# Patient Record
Sex: Female | Born: 1975 | Race: White | Marital: Married | Smoking: Current every day smoker
Health system: Southern US, Community
[De-identification: ages and names within clinical notes are randomized; demographics above are authoritative.]

## PROBLEM LIST (undated history)

## (undated) DIAGNOSIS — F419 Anxiety disorder, unspecified: Secondary | ICD-10-CM

## (undated) DIAGNOSIS — E039 Hypothyroidism, unspecified: Secondary | ICD-10-CM

## (undated) DIAGNOSIS — E669 Obesity, unspecified: Secondary | ICD-10-CM

## (undated) DIAGNOSIS — K047 Periapical abscess without sinus: Secondary | ICD-10-CM

## (undated) HISTORY — PX: ROOT CANAL: SHX2363

---
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
## 2010-06-25 ENCOUNTER — Other Ambulatory Visit (HOSPITAL_COMMUNITY): Payer: Self-pay | Admitting: Endocrinology

## 2010-06-25 DIAGNOSIS — E0500 Thyrotoxicosis with diffuse goiter without thyrotoxic crisis or storm: Secondary | ICD-10-CM

## 2010-06-30 ENCOUNTER — Encounter (HOSPITAL_COMMUNITY)
Admission: RE | Admit: 2010-06-30 | Discharge: 2010-06-30 | Disposition: A | Discharge status: Home / Self Care | Payer: BC Managed Care – PPO | Source: Ambulatory Visit | Attending: Endocrinology | Admitting: Endocrinology

## 2010-06-30 DIAGNOSIS — E0500 Thyrotoxicosis with diffuse goiter without thyrotoxic crisis or storm: Secondary | ICD-10-CM | POA: Insufficient documentation

## 2010-07-01 ENCOUNTER — Ambulatory Visit (HOSPITAL_COMMUNITY)
Admission: RE | Admit: 2010-07-01 | Discharge: 2010-07-01 | Disposition: A | Discharge status: Home / Self Care | Payer: BC Managed Care – PPO | Source: Ambulatory Visit | Attending: Endocrinology | Admitting: Endocrinology

## 2010-07-01 DIAGNOSIS — E0500 Thyrotoxicosis with diffuse goiter without thyrotoxic crisis or storm: Secondary | ICD-10-CM | POA: Insufficient documentation

## 2010-07-01 MED ORDER — SODIUM IODIDE I 131 CAPSULE
10.0000 | Freq: Once | INTRAVENOUS | Status: AC | PRN
  Administered 2010-06-30: 10 via ORAL

## 2010-07-01 MED ORDER — SODIUM PERTECHNETATE TC 99M INJECTION
9.8000 | Freq: Once | INTRAVENOUS | Status: AC | PRN
  Administered 2010-07-01: 9.8 via INTRAVENOUS

## 2010-07-03 ENCOUNTER — Other Ambulatory Visit (HOSPITAL_COMMUNITY): Payer: Self-pay | Admitting: Endocrinology

## 2010-07-03 ENCOUNTER — Ambulatory Visit (HOSPITAL_COMMUNITY): Payer: Self-pay

## 2010-07-03 DIAGNOSIS — E0500 Thyrotoxicosis with diffuse goiter without thyrotoxic crisis or storm: Secondary | ICD-10-CM

## 2010-07-07 ENCOUNTER — Ambulatory Visit (HOSPITAL_COMMUNITY): Payer: Self-pay

## 2010-07-08 ENCOUNTER — Other Ambulatory Visit (HOSPITAL_COMMUNITY): Payer: Self-pay

## 2010-07-17 ENCOUNTER — Encounter (HOSPITAL_COMMUNITY)
Admission: RE | Admit: 2010-07-17 | Discharge: 2010-07-17 | Disposition: A | Discharge status: Home / Self Care | Payer: BC Managed Care – PPO | Source: Ambulatory Visit | Attending: Endocrinology | Admitting: Endocrinology

## 2010-07-17 DIAGNOSIS — E0500 Thyrotoxicosis with diffuse goiter without thyrotoxic crisis or storm: Secondary | ICD-10-CM | POA: Insufficient documentation

## 2010-07-17 LAB — HCG, SERUM, QUALITATIVE: Preg, Serum: NEGATIVE

## 2010-07-17 MED ORDER — SODIUM IODIDE I 131 CAPSULE
12.7000 | Freq: Once | INTRAVENOUS | Status: AC | PRN
  Administered 2010-07-17: 12.7 via ORAL

## 2017-05-28 ENCOUNTER — Emergency Department (HOSPITAL_COMMUNITY): Payer: BLUE CROSS/BLUE SHIELD

## 2017-05-28 ENCOUNTER — Inpatient Hospital Stay (HOSPITAL_COMMUNITY)
Admission: EM | Admit: 2017-05-28 | Discharge: 2017-06-04 | DRG: 158 | Disposition: A | Discharge status: Home / Self Care | Payer: BLUE CROSS/BLUE SHIELD | Attending: Family Medicine | Admitting: Family Medicine

## 2017-05-28 ENCOUNTER — Other Ambulatory Visit: Payer: Self-pay

## 2017-05-28 DIAGNOSIS — L03211 Cellulitis of face: Secondary | ICD-10-CM | POA: Diagnosis present

## 2017-05-28 DIAGNOSIS — E669 Obesity, unspecified: Secondary | ICD-10-CM | POA: Diagnosis present

## 2017-05-28 DIAGNOSIS — K122 Cellulitis and abscess of mouth: Principal | ICD-10-CM | POA: Diagnosis present

## 2017-05-28 DIAGNOSIS — E876 Hypokalemia: Secondary | ICD-10-CM | POA: Diagnosis present

## 2017-05-28 DIAGNOSIS — T380X5A Adverse effect of glucocorticoids and synthetic analogues, initial encounter: Secondary | ICD-10-CM | POA: Diagnosis present

## 2017-05-28 DIAGNOSIS — Z79890 Hormone replacement therapy (postmenopausal): Secondary | ICD-10-CM

## 2017-05-28 DIAGNOSIS — E039 Hypothyroidism, unspecified: Secondary | ICD-10-CM | POA: Diagnosis present

## 2017-05-28 DIAGNOSIS — K047 Periapical abscess without sinus: Secondary | ICD-10-CM | POA: Diagnosis not present

## 2017-05-28 DIAGNOSIS — R739 Hyperglycemia, unspecified: Secondary | ICD-10-CM | POA: Diagnosis present

## 2017-05-28 DIAGNOSIS — F17210 Nicotine dependence, cigarettes, uncomplicated: Secondary | ICD-10-CM | POA: Diagnosis present

## 2017-05-28 DIAGNOSIS — B373 Candidiasis of vulva and vagina: Secondary | ICD-10-CM | POA: Diagnosis present

## 2017-05-28 DIAGNOSIS — Z7952 Long term (current) use of systemic steroids: Secondary | ICD-10-CM

## 2017-05-28 DIAGNOSIS — D72829 Elevated white blood cell count, unspecified: Secondary | ICD-10-CM | POA: Diagnosis present

## 2017-05-28 DIAGNOSIS — Z6841 Body Mass Index (BMI) 40.0 and over, adult: Secondary | ICD-10-CM

## 2017-05-28 HISTORY — DX: Obesity, unspecified: E66.9

## 2017-05-28 HISTORY — DX: Periapical abscess without sinus: K04.7

## 2017-05-28 HISTORY — DX: Hypothyroidism, unspecified: E03.9

## 2017-05-28 HISTORY — DX: Anxiety disorder, unspecified: F41.9

## 2017-05-28 LAB — CBC WITH DIFFERENTIAL/PLATELET
BASOS ABS: 0 10*3/uL (ref 0.0–0.1)
Basophils Relative: 0 %
EOS ABS: 0 10*3/uL (ref 0.0–0.7)
Eosinophils Relative: 0 %
HCT: 42.5 % (ref 36.0–46.0)
HEMOGLOBIN: 13.2 g/dL (ref 12.0–15.0)
LYMPHS ABS: 3.9 10*3/uL (ref 0.7–4.0)
LYMPHS PCT: 19 %
MCH: 25.9 pg — AB (ref 26.0–34.0)
MCHC: 31.1 g/dL (ref 30.0–36.0)
MCV: 83.3 fL (ref 78.0–100.0)
Monocytes Absolute: 1.9 10*3/uL — ABNORMAL HIGH (ref 0.1–1.0)
Monocytes Relative: 9 %
Neutro Abs: 15.3 10*3/uL — ABNORMAL HIGH (ref 1.7–7.7)
Neutrophils Relative %: 72 %
Platelets: 451 10*3/uL — ABNORMAL HIGH (ref 150–400)
RBC: 5.1 MIL/uL (ref 3.87–5.11)
RDW: 16.2 % — ABNORMAL HIGH (ref 11.5–15.5)
WBC: 21.2 10*3/uL — AB (ref 4.0–10.5)

## 2017-05-28 LAB — COMPREHENSIVE METABOLIC PANEL
ALK PHOS: 80 U/L (ref 38–126)
ALT: 11 U/L — ABNORMAL LOW (ref 14–54)
AST: 21 U/L (ref 15–41)
Albumin: 3.3 g/dL — ABNORMAL LOW (ref 3.5–5.0)
Anion gap: 12 (ref 5–15)
BUN: 12 mg/dL (ref 6–20)
CALCIUM: 8.9 mg/dL (ref 8.9–10.3)
CHLORIDE: 106 mmol/L (ref 101–111)
CO2: 22 mmol/L (ref 22–32)
CREATININE: 0.69 mg/dL (ref 0.44–1.00)
GFR calc non Af Amer: >60 mL/min (ref >60)
GLUCOSE: 183 mg/dL — AB (ref 65–99)
Potassium: 3.2 mmol/L — ABNORMAL LOW (ref 3.5–5.1)
SODIUM: 140 mmol/L (ref 135–145)
Total Bilirubin: 0.5 mg/dL (ref 0.3–1.2)
Total Protein: 6.2 g/dL — ABNORMAL LOW (ref 6.5–8.1)

## 2017-05-28 LAB — POC URINE PREG, ED: Preg Test, Ur: NEGATIVE

## 2017-05-28 MED ORDER — CLINDAMYCIN PHOSPHATE 600 MG/50ML IV SOLN
600.0000 mg | Freq: Once | INTRAVENOUS | Status: AC
  Administered 2017-05-29: 600 mg via INTRAVENOUS
  Filled 2017-05-28: qty 50

## 2017-05-28 MED ORDER — ONDANSETRON HCL 4 MG/2ML IJ SOLN
4.0000 mg | Freq: Once | INTRAMUSCULAR | Status: AC
Start: 2017-05-28 — End: 2017-05-28
  Administered 2017-05-28: 4 mg via INTRAVENOUS
  Filled 2017-05-28: qty 2

## 2017-05-28 MED ORDER — IOPAMIDOL (ISOVUE-300) INJECTION 61%
INTRAVENOUS | Status: AC
  Administered 2017-05-28: 75 mL
  Filled 2017-05-28: qty 75

## 2017-05-28 MED ORDER — MORPHINE SULFATE (PF) 4 MG/ML IV SOLN
6.0000 mg | Freq: Once | INTRAVENOUS | Status: AC
  Administered 2017-05-28: 6 mg via INTRAVENOUS
  Filled 2017-05-28: qty 2

## 2017-05-28 MED ORDER — IOPAMIDOL (ISOVUE-300) INJECTION 61%
INTRAVENOUS | Status: AC
  Filled 2017-05-28: qty 75

## 2017-05-28 NOTE — ED Notes (Signed)
ED Provider at bedside. 

## 2017-05-28 NOTE — ED Triage Notes (Signed)
Pt in reports root canal x 10 days ago, pt started antibiotic Amoxicillen pt took a wks worth and then was stopped because R facial swelling persisted, pt then switched to Augmentin this Tues, pt seen by dentist today and told to come here for worsening R sided facial swelling, pt airway intact, speaks in full sentences, no drooling noted, A&O x4

## 2017-05-28 NOTE — ED Notes (Addendum)
Last took one ultracet (37.5-325) at 1830 today. No relief reported. Ice pack given for comfort.

## 2017-05-28 NOTE — ED Provider Notes (Signed)
[facility]ENC[facility] Provider Note   CSN: [id number] Arrival date & time: 05/28/17  1517     History   Chief Complaint Chief Complaint  Patient presents with  . Facial Swelling    HPI Ashley Donaldson is a 42 y.o. female.  HPI   42 year old female sent here from her oral surgeon for evaluation of neck pain and swelling.  Patient had a root canal on the right lower teeth approximately 10 days ago.  Since then she developed pain and swelling to the affected area.  She was initially treated with amoxicillin then switched over to Augmentin along with prednisone but her symptom has not improved.  She also endorsed having a fever 2 days ago.  She was seen by her oral surgeon today.  She was told that her tooth appears to be normal however the swelling is concerning and patient was recommended to come to the ER for further evaluation.  Patient has been trying eyes, Tylenol ibuprofen and Ultracet at home with some improvement.  She reports chewing makes it worse.  She denies having sore throat or difficulty swallowing.  She did report some pressure behind the right ear but denies any hearing changes.  No shortness of breath, abdominal pain.     No past medical history on file.  There are no active problems to display for this patient.   The histories are not reviewed yet. Please review them in the "History" navigator section and refresh this SmartLink.  OB History    No data available       Home Medications    Prior to Admission medications   Medication Sig Start Date End Date Taking? Authorizing Provider  acetaminophen (TYLENOL) 500 MG tablet Take 500 mg by mouth every 6 (six) hours as needed for headache (pain).   Yes [provider]  ALPRAZolam Prudy Feeler(XANAX) 0.5 MG tablet Take 0.5 mg by mouth daily as needed for anxiety.   Yes [provider]  amoxicillin-clavulanate (AUGMENTIN) 500-125 MG tablet Take 1 tablet by mouth 3 (three) times  daily. 10 day course started 05/25/17   Yes [provider]  Guaifenesin (MUCINEX MAXIMUM STRENGTH) 1200 MG TB12 Take 1,200 mg by mouth 2 (two) times daily as needed (sinus headache).   Yes [provider]  ibuprofen (ADVIL,MOTRIN) 200 MG tablet Take 400 mg by mouth every 6 (six) hours as needed (pain).   Yes [provider]  levothyroxine (SYNTHROID, LEVOTHROID) 175 MCG tablet Take 175 mcg by mouth daily before breakfast.   Yes [provider]  predniSONE (DELTASONE) 5 MG tablet Take 5-30 mg by mouth See admin instructions. Tapered course started 05/25/17: take six tablets (30 mg) by mouth 1st day, then take five tablets (25 mg) 2nd day, then take four tablets (20 mg) 3rd day, then take three tablets (15 mg) 4th day, then take two tablets (10 mg) 5th day, then take one tablet (5 mg) 6th day, then stop   Yes [provider]  traMADol-acetaminophen (ULTRACET) 37.5-325 MG tablet Take 1-2 tablets by mouth every 4 (four) hours as needed (pain).   Yes [provider]    Family History No family history on file.  Social History Social History   Tobacco Use  . Smoking status: Not on file  Substance Use Topics  . Alcohol use: Not on file  . Drug use: Not on file     Allergies   Methylpyrrolidone; Ciprofloxacin; and Omni-pac   Review of Systems  Review of Systems  All other systems reviewed and are negative.    Physical Exam Updated Vital Signs BP (!) 143/87   Pulse 67   Temp 98.1 F (36.7 C) (Oral)   Resp 16   Ht 5\' 1"  (1.549 m)   Wt 108.9 kg (240 lb)   LMP 04/28/2017 (Approximate)   SpO2 98%   BMI 45.35 kg/m   Physical Exam  Constitutional: She appears well-developed and well-nourished. No distress.  Obese female appears uncomfortable but nontoxic in appearance  HENT:  Head: Atraumatic.  Ears: TMs are normal bilaterally Nose: Normal nares Throat: Mild trismus, white plaque noted on the oral mucosa, tenderness to right  lower gumline.  Moderate amount of swelling and induration noted to right lower chin with tenderness to palpation.  Eyes: Conjunctivae are normal.  Neck: Normal range of motion. Neck supple. No tracheal deviation present. No thyromegaly present.  Cardiovascular: Normal rate and regular rhythm.  Pulmonary/Chest: Effort normal and breath sounds normal.  Abdominal: Soft. Bowel sounds are normal. She exhibits no distension. There is no tenderness.  Lymphadenopathy:    She has cervical adenopathy.  Neurological: She is alert.  Skin: No rash noted.  Psychiatric: She has a normal mood and affect.  Nursing note and vitals reviewed.    ED Treatments / Results  Labs (all labs ordered are listed, but only abnormal results are displayed) Labs Reviewed  CBC WITH DIFFERENTIAL/PLATELET - Abnormal; Notable for the following components:      Result Value   WBC 21.2 (*)    MCH 25.9 (*)    RDW 16.2 (*)    Platelets 451 (*)    Neutro Abs 15.3 (*)    Monocytes Absolute 1.9 (*)    All other components within normal limits  COMPREHENSIVE METABOLIC PANEL - Abnormal; Notable for the following components:   Potassium 3.2 (*)    Glucose, Bld 183 (*)    Total Protein 6.2 (*)    Albumin 3.3 (*)    ALT 11 (*)    All other components within normal limits  POC URINE PREG, ED    EKG  EKG Interpretation None       Radiology Ct Soft Tissue Neck W Contrast  Result Date: 05/28/2017 CLINICAL DATA:  RIGHT facial swelling after root canal 10 days ago. On antibiotics without improvement. History of Graves disease. EXAM: CT NECK WITH CONTRAST TECHNIQUE: Multidetector CT imaging of the neck was performed using the standard protocol following the bolus administration of intravenous contrast. CONTRAST:  75mL ISOVUE-300 IOPAMIDOL (ISOVUE-300) INJECTION 61% COMPARISON:  None. FINDINGS: PHARYNX AND LARYNX: Mild RIGHT peritonsillar inflammation. Normal larynx. Normal epiglottis. SALIVARY GLANDS: Normal. THYROID:  Normal. LYMPH NODES: Prominent though not pathologically enlarged RIGHT level 1 B lymph nodes are likely reactive. VASCULAR: Normal. LIMITED INTRACRANIAL: Normal. VISUALIZED ORBITS: Normal. MASTOIDS AND VISUALIZED PARANASAL SINUSES: Well-aerated. SKELETON: Scattered dental caries. Tooth 9 and approximate tooth 31 periapical abscess with osseous dehiscence and cortical disruption. UPPER CHEST: Lung apices are clear. No superior mediastinal lymphadenopathy. OTHER: 2.9 x 3.3 x 3.2 cm rim enhancing fluid collection contiguous with body of the RIGHT mandible. RIGHT lower face free fluid extending into the RIGHT submandibular space, RIGHT parotid space with platysma thickening. RIGHT floor of mouth effusion. No subcutaneous gas or radiopaque foreign bodies. Inflammatory changes RIGHT retromolar trigone, RIGHT parapharyngeal fat planes inferiorly. IMPRESSION: 1. Approximate tooth 31 periapical abscess with osseous dehiscence. 2.9 x 3.3 x 3.2 cm associated RIGHT body of the mandible subperiosteal abscess. RIGHT lower  face cellulitis, transspatial edema and effusion. Electronically Signed   By: Awilda Metro M.D.   On: 05/28/2017 23:23    Procedures Procedures (including critical care time)  Medications Ordered in ED Medications  iopamidol (ISOVUE-300) 61 % injection (not administered)  clindamycin (CLEOCIN) IVPB 600 mg (not administered)  morphine 4 MG/ML injection 6 mg (6 mg Intravenous Given 05/28/17 2213)  ondansetron (ZOFRAN) injection 4 mg (4 mg Intravenous Given 05/28/17 2209)  iopamidol (ISOVUE-300) 61 % injection (75 mLs  Contrast Given 05/28/17 2259)     Initial Impression / Assessment and Plan / ED Course  I have reviewed the triage vital signs and the nursing notes.  Pertinent labs & imaging results that were available during my care of the patient were reviewed by me and considered in my medical decision making (see chart for details).     BP (!) 148/83 (BP Location: Left Arm)   Pulse 69    Temp 98.1 F (36.7 C) (Oral)   Resp 14   Ht 5\' 1"  (1.549 m)   Wt 108.9 kg (240 lb)   LMP 04/28/2017 (Approximate)   SpO2 93%   BMI 45.35 kg/m    Final Clinical Impressions(s) / ED Diagnoses   Final diagnoses:  Periapical abscess with facial involvement    ED Discharge Orders    None     9:42 PM Patient here with pain and swelling to her right lower  chin after having a root canal 10 days ago.  Labs remarkable for a WBC of 21.2.  She this is likely due to underlying infection as well as recently been on steroid.  Patient will benefit from a soft tissue neck for further evaluation.  Pain medication and antinausea medication given.  11:42 PM Neck soft tissues CT scan demonstrate a periapical abscess with osseous dehiscence measuring approximately 2.9 x 3.3 x 3.2 cm with surrounding facial cellulitis.  No airway compromise.  At this time, patient given IV clindamycin, will consult ENT specialist for recommendation.  12:33 AM I have consulted with oral surgeon Dr. Nena Polio [phone]) who recommend pt to be admit for 24 hrs observation.  Keeps head elevated at 45 degrees to help with swelling, and to monitor for airway compromised.  He will be available as needed for consultation.  Care discussed with Dr. Fredderick Phenix.  Pt currently receiving IV abx and appears comfortable.    1:05 AM Appreciate consultation with [facility] Dr. Katrinka Blazing who agrees to see and admit pt for obs.  Pt agrees with plan.    Fayrene Helper, PA-C 05/29/17 0106    Rolan Bucco, MD 05/29/17 1355

## 2017-05-29 ENCOUNTER — Encounter (HOSPITAL_COMMUNITY): Payer: Self-pay | Admitting: Internal Medicine

## 2017-05-29 DIAGNOSIS — L03211 Cellulitis of face: Secondary | ICD-10-CM | POA: Diagnosis present

## 2017-05-29 DIAGNOSIS — F17210 Nicotine dependence, cigarettes, uncomplicated: Secondary | ICD-10-CM | POA: Diagnosis present

## 2017-05-29 DIAGNOSIS — R739 Hyperglycemia, unspecified: Secondary | ICD-10-CM | POA: Diagnosis present

## 2017-05-29 DIAGNOSIS — B373 Candidiasis of vulva and vagina: Secondary | ICD-10-CM | POA: Diagnosis present

## 2017-05-29 DIAGNOSIS — D72829 Elevated white blood cell count, unspecified: Secondary | ICD-10-CM | POA: Diagnosis not present

## 2017-05-29 DIAGNOSIS — E039 Hypothyroidism, unspecified: Secondary | ICD-10-CM | POA: Diagnosis present

## 2017-05-29 DIAGNOSIS — Z7952 Long term (current) use of systemic steroids: Secondary | ICD-10-CM | POA: Diagnosis not present

## 2017-05-29 DIAGNOSIS — K122 Cellulitis and abscess of mouth: Secondary | ICD-10-CM | POA: Diagnosis present

## 2017-05-29 DIAGNOSIS — E669 Obesity, unspecified: Secondary | ICD-10-CM | POA: Diagnosis present

## 2017-05-29 DIAGNOSIS — T380X5A Adverse effect of glucocorticoids and synthetic analogues, initial encounter: Secondary | ICD-10-CM | POA: Diagnosis present

## 2017-05-29 DIAGNOSIS — K047 Periapical abscess without sinus: Secondary | ICD-10-CM | POA: Diagnosis present

## 2017-05-29 DIAGNOSIS — E876 Hypokalemia: Secondary | ICD-10-CM | POA: Diagnosis present

## 2017-05-29 DIAGNOSIS — Z6841 Body Mass Index (BMI) 40.0 and over, adult: Secondary | ICD-10-CM | POA: Diagnosis not present

## 2017-05-29 DIAGNOSIS — Z79890 Hormone replacement therapy (postmenopausal): Secondary | ICD-10-CM | POA: Diagnosis not present

## 2017-05-29 LAB — CBC
HEMATOCRIT: 38.3 % (ref 36.0–46.0)
HEMOGLOBIN: 12 g/dL (ref 12.0–15.0)
MCH: 26.4 pg (ref 26.0–34.0)
MCHC: 31.3 g/dL (ref 30.0–36.0)
MCV: 84.2 fL (ref 78.0–100.0)
Platelets: 394 10*3/uL (ref 150–400)
RBC: 4.55 MIL/uL (ref 3.87–5.11)
RDW: 16.5 % — AB (ref 11.5–15.5)
WBC: 17.8 10*3/uL — ABNORMAL HIGH (ref 4.0–10.5)

## 2017-05-29 LAB — BASIC METABOLIC PANEL
Anion gap: 11 (ref 5–15)
BUN: 7 mg/dL (ref 6–20)
CALCIUM: 8.6 mg/dL — AB (ref 8.9–10.3)
CHLORIDE: 102 mmol/L (ref 101–111)
CO2: 24 mmol/L (ref 22–32)
CREATININE: 0.62 mg/dL (ref 0.44–1.00)
GFR calc non Af Amer: >60 mL/min (ref >60)
GLUCOSE: 247 mg/dL — AB (ref 65–99)
Potassium: 3.6 mmol/L (ref 3.5–5.1)
Sodium: 137 mmol/L (ref 135–145)

## 2017-05-29 MED ORDER — CLINDAMYCIN PHOSPHATE 600 MG/50ML IV SOLN
600.0000 mg | Freq: Three times a day (TID) | INTRAVENOUS | Status: DC
  Administered 2017-05-29 – 2017-05-31 (×7): 600 mg via INTRAVENOUS
  Filled 2017-05-29 (×8): qty 50

## 2017-05-29 MED ORDER — ENOXAPARIN SODIUM 40 MG/0.4ML ~~LOC~~ SOLN
40.0000 mg | Freq: Every day | SUBCUTANEOUS | Status: DC
  Administered 2017-05-29 – 2017-06-03 (×5): 40 mg via SUBCUTANEOUS
  Filled 2017-05-29 (×5): qty 0.4

## 2017-05-29 MED ORDER — ACETAMINOPHEN 325 MG PO TABS
650.0000 mg | ORAL_TABLET | Freq: Four times a day (QID) | ORAL | Status: DC | PRN
  Administered 2017-06-04: 650 mg via ORAL
  Filled 2017-05-29: qty 2

## 2017-05-29 MED ORDER — IBUPROFEN 200 MG PO TABS
400.0000 mg | ORAL_TABLET | Freq: Four times a day (QID) | ORAL | Status: DC | PRN
  Administered 2017-05-29 – 2017-05-31 (×7): 400 mg via ORAL
  Filled 2017-05-29 (×7): qty 2

## 2017-05-29 MED ORDER — DEXAMETHASONE SODIUM PHOSPHATE 10 MG/ML IJ SOLN: 10.0000 mg | Freq: Once | INTRAMUSCULAR | Status: DC

## 2017-05-29 MED ORDER — LEVOTHYROXINE SODIUM 75 MCG PO TABS
175.0000 ug | ORAL_TABLET | Freq: Every day | ORAL | Status: DC
  Administered 2017-05-29 – 2017-06-04 (×7): 175 ug via ORAL
  Filled 2017-05-29 (×7): qty 1

## 2017-05-29 MED ORDER — HYDROCODONE-ACETAMINOPHEN 5-325 MG PO TABS
2.0000 | ORAL_TABLET | ORAL | Status: AC
  Administered 2017-05-29: 2 via ORAL
  Filled 2017-05-29: qty 2

## 2017-05-29 MED ORDER — HYDROCODONE-ACETAMINOPHEN 5-325 MG PO TABS
1.0000 | ORAL_TABLET | Freq: Four times a day (QID) | ORAL | Status: DC | PRN
  Administered 2017-05-29 – 2017-05-30 (×4): 2 via ORAL
  Filled 2017-05-29 (×4): qty 2

## 2017-05-29 MED ORDER — ONDANSETRON HCL 4 MG/2ML IJ SOLN: 4.0000 mg | Freq: Four times a day (QID) | INTRAMUSCULAR | Status: DC | PRN

## 2017-05-29 MED ORDER — ONDANSETRON HCL 4 MG PO TABS: 4.0000 mg | ORAL_TABLET | Freq: Four times a day (QID) | ORAL | Status: DC | PRN

## 2017-05-29 MED ORDER — PREDNISONE 5 MG PO TABS
15.0000 mg | ORAL_TABLET | Freq: Every day | ORAL | Status: DC
  Filled 2017-05-29: qty 1

## 2017-05-29 MED ORDER — POTASSIUM CHLORIDE 10 MEQ/100ML IV SOLN
10.0000 meq | INTRAVENOUS | Status: AC
  Administered 2017-05-29 (×3): 10 meq via INTRAVENOUS
  Filled 2017-05-29 (×3): qty 100

## 2017-05-29 MED ORDER — PREDNISONE 5 MG PO TABS
5.0000 mg | ORAL_TABLET | Freq: Every day | ORAL | Status: AC
  Administered 2017-05-29: 5 mg via ORAL

## 2017-05-29 MED ORDER — PREDNISONE 10 MG PO TABS
10.0000 mg | ORAL_TABLET | Freq: Every day | ORAL | Status: AC
  Administered 2017-05-29: 10 mg via ORAL

## 2017-05-29 MED ORDER — ACETAMINOPHEN 650 MG RE SUPP: 650.0000 mg | Freq: Four times a day (QID) | RECTAL | Status: DC | PRN

## 2017-05-29 MED ORDER — PREDNISONE 5 MG PO TABS
5.0000 mg | ORAL_TABLET | ORAL | Status: DC
Start: 2017-05-29 — End: 2017-05-29

## 2017-05-29 NOTE — ED Notes (Signed)
Admitting Provider at bedside. 

## 2017-05-29 NOTE — ED Notes (Signed)
ED Provider at bedside. 

## 2017-05-29 NOTE — H&P (Signed)
History and Physical    [name] ZOX:[id number] DOB: [birth year] DOA: 05/28/2017  Referring MD/NP/PA: Fayrene Helper, PA-C PCP: System, Pcp Not In  Patient coming from: home  Chief Complaint: Pain and swelling of jaw  I have personally briefly reviewed patient's old medical records in [facility]   HPI: Liviana Donaldson is a 42 y.o. female with medical history significant of hypothyroidism, anxiety,  obesity, and migraine headaches; who presents with complaints of right lower jaw pain and swelling.  Patient reports that she had a root canal performed 10 days ago and was initially placed on amoxicillin and steroid taper.  However, developed swelling and pain in right lower jaw and therefore went back to her dentist  4 days ago; and was placed on Augmentin.  She had been also utilizing ibuprofen and Tylenol with mild relief of symptoms.  Despite this medication patient notes worsening swelling of her right lower jaw and face.  Complains of associated symptoms of low-grade fevers 99.5 F taking Tylenol, ear fullness, and mild difficulty swallowing on the right side of her mouth. Denies any shortness of breath, wheezing, abdominal pain, nausea, vomiting, diarrhea, or chest pain symptoms.   ED Course: Upon admission into the emergency department patient was noted to be afebrile, pulse 60-101, respirations 14-18, blood pressure 123/71-160 5/99, O2 saturations 93-100%.  Labs revealed WBC 21.2, potassium 3.2, and glucose 183.  Dr. Viviann Spare Mohorn of oral surgery was notified and recommended observation with antibiotics and to call if needed.     Past Medical History:  Diagnosis Date  . Hypothyroidism     Past Surgical History:  Procedure Laterality Date  . ROOT CANAL       has no tobacco, alcohol, and drug history on file.  Allergies  Allergen Reactions  . Methylpyrrolidone Shortness Of Breath    Reported by [facility] 04/24/14 - pt not aware of this allergy  . Ciprofloxacin Rash    . Omni-Pac Rash    "cefdinir"    No family history on file.  Prior to Admission medications   Medication Sig Start Date End Date Taking? Authorizing Provider  acetaminophen (TYLENOL) 500 MG tablet Take 500 mg by mouth every 6 (six) hours as needed for headache (pain).   Yes [provider]  ALPRAZolam Prudy Feeler) 0.5 MG tablet Take 0.5 mg by mouth daily as needed for anxiety.   Yes [provider]  amoxicillin-clavulanate (AUGMENTIN) 500-125 MG tablet Take 1 tablet by mouth 3 (three) times daily. 10 day course started 05/25/17   Yes [provider]  Guaifenesin (MUCINEX MAXIMUM STRENGTH) 1200 MG TB12 Take 1,200 mg by mouth 2 (two) times daily as needed (sinus headache).   Yes [provider]  ibuprofen (ADVIL,MOTRIN) 200 MG tablet Take 400 mg by mouth every 6 (six) hours as needed (pain).   Yes [provider]  levothyroxine (SYNTHROID, LEVOTHROID) 175 MCG tablet Take 175 mcg by mouth daily before breakfast.   Yes [provider]  predniSONE (DELTASONE) 5 MG tablet Take 5-30 mg by mouth See admin instructions. Tapered course started 05/25/17: take six tablets (30 mg) by mouth 1st day, then take five tablets (25 mg) 2nd day, then take four tablets (20 mg) 3rd day, then take three tablets (15 mg) 4th day, then take two tablets (10 mg) 5th day, then take one tablet (5 mg) 6th day, then stop   Yes [provider]  traMADol-acetaminophen (ULTRACET) 37.5-325 MG tablet Take 1-2 tablets by mouth every 4 (four) hours  as needed (pain).   Yes [provider]    Physical Exam:  Constitutional: Obese female who appears to be uncomfortable but able to handle secretions Vitals:   05/29/17 0200 05/29/17 0230 05/29/17 0300 05/29/17 0340  BP: 132/71 136/78 122/80 131/74  Pulse: 62 84 69 72  Resp:      Temp:    98.6 F (37 C)  TempSrc:    Oral  SpO2: 96% 97% 95% 97%  Weight:      Height:       Eyes: PERRL, lids and conjunctivae  normal ENMT: Bulging tympanic membrane noted on the right ear.  Mucous membranes are moist.  Tenderness of the jaw with erythema and induration of the lower jaw. Neck:  significant right-sided cervical lymphadenopathy present.  No stridor noted  Respiratory: clear to auscultation bilaterally, no wheezing, no crackles. Normal respiratory effort. No accessory muscle use.  Cardiovascular: Regular rate and rhythm, no murmurs / rubs / gallops. No extremity edema. 2+ pedal pulses. No carotid bruits.  Abdomen: no tenderness, no masses palpated. No hepatosplenomegaly. Bowel sounds positive.  Musculoskeletal: no clubbing / cyanosis. No joint deformity upper and lower extremities. Good ROM, no contractures. Normal muscle tone.  Skin: no rashes, lesions, ulcers. No induration Neurologic: CN 2-12 grossly intact. Sensation intact, DTR normal. Strength 5/5 in all 4.  Psychiatric: Normal judgment and insight. Alert and oriented x 3. Normal mood.     Labs on Admission: I have personally reviewed following labs and imaging studies  CBC: Recent Labs  Lab 05/28/17 1544  WBC 21.2*  NEUTROABS 15.3*  HGB 13.2  HCT 42.5  MCV 83.3  PLT 451*   Basic Metabolic Panel: Recent Labs  Lab 05/28/17 1544  NA 140  K 3.2*  CL 106  CO2 22  GLUCOSE 183*  BUN 12  CREATININE 0.69  CALCIUM 8.9   GFR: Estimated Creatinine Clearance: 106.5 mL/min (by C-G formula based on SCr of 0.69 mg/dL). Liver Function Tests: Recent Labs  Lab 05/28/17 1544  AST 21  ALT 11*  ALKPHOS 80  BILITOT 0.5  PROT 6.2*  ALBUMIN 3.3*   No results for input(s): LIPASE, AMYLASE in the last 168 hours. No results for input(s): AMMONIA in the last 168 hours. Coagulation Profile: No results for input(s): INR, PROTIME in the last 168 hours. Cardiac Enzymes: No results for input(s): CKTOTAL, CKMB, CKMBINDEX, TROPONINI in the last 168 hours. BNP (last 3 results) No results for input(s): PROBNP in the last 8760 hours. HbA1C: No  results for input(s): HGBA1C in the last 72 hours. CBG: No results for input(s): GLUCAP in the last 168 hours. Lipid Profile: No results for input(s): CHOL, HDL, LDLCALC, TRIG, CHOLHDL, LDLDIRECT in the last 72 hours. Thyroid Function Tests: No results for input(s): TSH, T4TOTAL, FREET4, T3FREE, THYROIDAB in the last 72 hours. Anemia Panel: No results for input(s): VITAMINB12, FOLATE, FERRITIN, TIBC, IRON, RETICCTPCT in the last 72 hours. Urine analysis: No results found for: COLORURINE, APPEARANCEUR, LABSPEC, PHURINE, GLUCOSEU, HGBUR, BILIRUBINUR, KETONESUR, PROTEINUR, UROBILINOGEN, NITRITE, LEUKOCYTESUR Sepsis Labs: No results found for this or any previous visit (from the past 240 hour(s)).   Radiological Exams on Admission: Ct Soft Tissue Neck W Contrast  Result Date: 05/28/2017 CLINICAL DATA:  RIGHT facial swelling after root canal 10 days ago. On antibiotics without improvement. History of Graves disease. EXAM: CT NECK WITH CONTRAST TECHNIQUE: Multidetector CT imaging of the neck was performed using the standard protocol following the bolus administration of intravenous contrast. CONTRAST:  75mL  ISOVUE-300 IOPAMIDOL (ISOVUE-300) INJECTION 61% COMPARISON:  None. FINDINGS: PHARYNX AND LARYNX: Mild RIGHT peritonsillar inflammation. Normal larynx. Normal epiglottis. SALIVARY GLANDS: Normal. THYROID: Normal. LYMPH NODES: Prominent though not pathologically enlarged RIGHT level 1 B lymph nodes are likely reactive. VASCULAR: Normal. LIMITED INTRACRANIAL: Normal. VISUALIZED ORBITS: Normal. MASTOIDS AND VISUALIZED PARANASAL SINUSES: Well-aerated. SKELETON: Scattered dental caries. Tooth 9 and approximate tooth 31 periapical abscess with osseous dehiscence and cortical disruption. UPPER CHEST: Lung apices are clear. No superior mediastinal lymphadenopathy. OTHER: 2.9 x 3.3 x 3.2 cm rim enhancing fluid collection contiguous with body of the RIGHT mandible. RIGHT lower face free fluid extending into the  RIGHT submandibular space, RIGHT parotid space with platysma thickening. RIGHT floor of mouth effusion. No subcutaneous gas or radiopaque foreign bodies. Inflammatory changes RIGHT retromolar trigone, RIGHT parapharyngeal fat planes inferiorly. IMPRESSION: 1. Approximate tooth 31 periapical abscess with osseous dehiscence. 2.9 x 3.3 x 3.2 cm associated RIGHT body of the mandible subperiosteal abscess. RIGHT lower face cellulitis, transspatial edema and effusion. Electronically Signed   By: Awilda Metroourtnay  Bloomer M.D.   On: 05/28/2017 23:23    CT scan of the neck: Independently reviewed.  Sinus wall abscess of the right lower jaw.  Assessment/Plan Periapical abscess: Acute.  Patient presents with complaints of right lower jaw swelling and pain after root canal 10 days ago despite being on antibiotics of amoxicillin and subsequently Augmentin.  Found to have - Admit to med-surge - Continue clindamycin IV - Hydrocodone prn pain - Continue prednisone per taper - May want to rediscuss  with Dr. Viviann SpareSteven Mohorn oral surgery in a.m.  Leukocytosis: Acute.  WBC elevated to 21.2 Suspect secondary to infection as seen above. - Recheck CBC in a.m.  Steroid-induced hyperglycemia: Initial blood glucose elevated 183, but patient currently on steroid taper. - Continue to monitor and place on insulin if needed  Hypothyroidism - Continue levothyroxine  Hypokalemia: acute.  Initial potassium noted to be 3.2 - Give 30 mEq of potassium chloride IV - Continue to monitor and replace as needed  DVT prophylaxis: lovenox  Code Status: Full  Family Communication: none present Disposition Plan: Discharge home in 1-2 days Consults called: Oral Surgery Admission status: Observation  Clydie Braunondell A Smith MD [facility] Pager [phone]   If 7PM-7AM, please contact night-coverage [email or web address] Password [email or web address]  05/29/2017, 6:53 AM

## 2017-05-29 NOTE — Progress Notes (Signed)
[facility] Note  Reviewed H&P by Dr. Katrinka BlazingSmith. Agree with plan. Called Dr. Elwyn LadeMohorn's telephone svc, awaiting call back.Cont abx for now.  Haydee SalterPhillip M Winda Summerall, MD

## 2017-05-30 LAB — HIV ANTIBODY (ROUTINE TESTING W REFLEX): HIV Screen 4th Generation wRfx: NONREACTIVE

## 2017-05-30 MED ORDER — HYDROMORPHONE HCL 1 MG/ML IJ SOLN
1.0000 mg | INTRAMUSCULAR | Status: DC | PRN
  Administered 2017-05-30 – 2017-05-31 (×6): 1 mg via INTRAVENOUS
  Filled 2017-05-30 (×6): qty 1

## 2017-05-30 NOTE — Progress Notes (Addendum)
[facility] PROGRESS NOTE  Ashley Donaldson MVH:[id number] DOB: 07/13/1975 DOA: 05/28/2017 PCP: System, Pcp Not In  Assessment/Plan:  Periapical abscess: Acute.  Patient presents with complaints of right lower jaw swelling and pain after root canal 10 days ago despite being on antibiotics of amoxicillin and subsequently Augmentin.  Found to have - Admit to med-surge - Continue clindamycin IV - Hydrocodone prn pain--->dilaudid - Continue prednisone per taper - Rediscussed with Dr. Viviann SpareSteven Mohorn oral surgery yesterday who advised continue admission and antibiotics.  Dates after few days can be better on its own but patient may need drainage.  I called pediatric dentist on call and awaiting callback.  Leukocytosis: Acute.  WBC elevated to 21.2-->17.8 Suspect secondary to infection as seen above. - Recheck CBC in a.m.  Steroid-induced hyperglycemia: Initial blood glucose elevated 183, but patient currently on steroid taper. - Continue to monitor and place on insulin if needed  Hypothyroidism - Continue levothyroxine  Hypokalemia: acute.  Initial potassium noted to be 3.2 - Give 30 mEq of potassium chloride IV - Continue to monitor and replace as needed  DVT prophylaxis: lovenox  Code Status: Full  Family Communication: none present Disposition Plan: Discharge home in 1-2 days Consults called: Oral Surgery Admission status: inpt    Consultants:  ---  Procedures:  ---  Antibiotics:  Cleocin 2/2 -->P  HPI/Subjective: Complains of worsening jaw pain and swelling.  Patient is tearful.  No issues of breathing.  Still able to open and close mouth.  Limited range of motion of jaw.  Objective: Vitals:   05/29/17 2010 05/30/17 0437  BP: 135/70 130/82  Pulse: 71 81  Resp:    Temp: 98.4 F (36.9 C) 98.4 F (36.9 C)  SpO2: 98% 98%    Intake/Output Summary (Last 24 hours) at 05/30/2017 1210 Last data filed at 05/29/2017 1500 Gross per 24 hour  Intake 290 ml   Output -  Net 290 ml   Filed Weights   05/28/17 1537  Weight: 108.9 kg (240 lb)    Exam:   General:  [place], R facial swelling submental on L and at angle of jaw  Cardiovascular: RRR, no MRG  Respiratory: CTAB, nl wob  Abdomen: BS+, ND  Musculoskeletal: moving all extr   Data Reviewed: Basic Metabolic Panel: Recent Labs  Lab 05/28/17 1544 05/29/17 0903  NA 140 137  K 3.2* 3.6  CL 106 102  CO2 22 24  GLUCOSE 183* 247*  BUN 12 7  CREATININE 0.69 0.62  CALCIUM 8.9 8.6*   Liver Function Tests: Recent Labs  Lab 05/28/17 1544  AST 21  ALT 11*  ALKPHOS 80  BILITOT 0.5  PROT 6.2*  ALBUMIN 3.3*   No results for input(s): LIPASE, AMYLASE in the last 168 hours. No results for input(s): AMMONIA in the last 168 hours. CBC: Recent Labs  Lab 05/28/17 1544 05/29/17 0903  WBC 21.2* 17.8*  NEUTROABS 15.3*  --   HGB 13.2 12.0  HCT 42.5 38.3  MCV 83.3 84.2  PLT 451* 394   Cardiac Enzymes: No results for input(s): CKTOTAL, CKMB, CKMBINDEX, TROPONINI in the last 168 hours. BNP (last 3 results) No results for input(s): BNP in the last 8760 hours.  ProBNP (last 3 results) No results for input(s): PROBNP in the last 8760 hours.  CBG: No results for input(s): GLUCAP in the last 168 hours.  No results found for this or any previous visit (from the past 240 hour(s)).   Studies: Ct Soft Tissue Neck W Contrast  Result Date: 05/28/2017 CLINICAL DATA:  RIGHT facial swelling after root canal 10 days ago. On antibiotics without improvement. History of Graves disease. EXAM: CT NECK WITH CONTRAST TECHNIQUE: Multidetector CT imaging of the neck was performed using the standard protocol following the bolus administration of intravenous contrast. CONTRAST:  75mL ISOVUE-300 IOPAMIDOL (ISOVUE-300) INJECTION 61% COMPARISON:  None. FINDINGS: PHARYNX AND LARYNX: Mild RIGHT peritonsillar inflammation. Normal larynx. Normal epiglottis. SALIVARY GLANDS: Normal. THYROID: Normal. LYMPH  NODES: Prominent though not pathologically enlarged RIGHT level 1 B lymph nodes are likely reactive. VASCULAR: Normal. LIMITED INTRACRANIAL: Normal. VISUALIZED ORBITS: Normal. MASTOIDS AND VISUALIZED PARANASAL SINUSES: Well-aerated. SKELETON: Scattered dental caries. Tooth 9 and approximate tooth 31 periapical abscess with osseous dehiscence and cortical disruption. UPPER CHEST: Lung apices are clear. No superior mediastinal lymphadenopathy. OTHER: 2.9 x 3.3 x 3.2 cm rim enhancing fluid collection contiguous with body of the RIGHT mandible. RIGHT lower face free fluid extending into the RIGHT submandibular space, RIGHT parotid space with platysma thickening. RIGHT floor of mouth effusion. No subcutaneous gas or radiopaque foreign bodies. Inflammatory changes RIGHT retromolar trigone, RIGHT parapharyngeal fat planes inferiorly. IMPRESSION: 1. Approximate tooth 31 periapical abscess with osseous dehiscence. 2.9 x 3.3 x 3.2 cm associated RIGHT body of the mandible subperiosteal abscess. RIGHT lower face cellulitis, transspatial edema and effusion. Electronically Signed   By: Awilda Metro M.D.   On: 05/28/2017 23:23    Scheduled Meds: . enoxaparin (LOVENOX) injection  40 mg Subcutaneous Daily  . levothyroxine  175 mcg Oral QAC breakfast   Continuous Infusions: . clindamycin (CLEOCIN) IV 600 mg (05/30/17 0501)    Principal Problem:   Ludwig's angina Active Problems:   Periapical abscess   Hypothyroidism   Steroid-induced hyperglycemia   Leukocytosis    Time spent: 35    Haydee Salter  [facility] Pager AMION. If 7PM-7AM, please contact night-coverage at [email or web address], password [facility] 05/30/2017, 12:10 PM  LOS: 1 day

## 2017-05-30 NOTE — Progress Notes (Signed)
Pt facial swelling is getting worse as compared to yesterday, still on antibiotics, attending doctor notified, will continue to monitor, thanks!

## 2017-05-30 NOTE — Plan of Care (Signed)
  Activity: Risk for activity intolerance will decrease 05/30/2017 1345 - Progressing by Darrow BussingArcilla, Samar Venneman M, RN   Nutrition: Adequate nutrition will be maintained 05/30/2017 1345 - Progressing by Darrow BussingArcilla, Tykeria Wawrzyniak M, RN   Coping: Level of anxiety will decrease 05/30/2017 1345 - Progressing by Darrow BussingArcilla, Ahniyah Giancola M, RN   Elimination: Will not experience complications related to bowel motility 05/30/2017 1345 - Progressing by Darrow BussingArcilla, Margorie Renner M, RN   Pain Managment: General experience of comfort will improve 05/30/2017 1345 - Progressing by Darrow BussingArcilla, Kailiana Granquist M, RN

## 2017-05-31 ENCOUNTER — Other Ambulatory Visit: Payer: Self-pay

## 2017-05-31 ENCOUNTER — Encounter (HOSPITAL_COMMUNITY): Payer: Self-pay | Admitting: General Practice

## 2017-05-31 DIAGNOSIS — K122 Cellulitis and abscess of mouth: Principal | ICD-10-CM

## 2017-05-31 LAB — CBC WITH DIFFERENTIAL/PLATELET
BASOS ABS: 0 10*3/uL (ref 0.0–0.1)
BASOS PCT: 0 %
EOS ABS: 0.2 10*3/uL (ref 0.0–0.7)
EOS PCT: 1 %
HCT: 39.2 % (ref 36.0–46.0)
Hemoglobin: 12.2 g/dL (ref 12.0–15.0)
LYMPHS PCT: 15 %
Lymphs Abs: 2.8 10*3/uL (ref 0.7–4.0)
MCH: 26.1 pg (ref 26.0–34.0)
MCHC: 31.1 g/dL (ref 30.0–36.0)
MCV: 83.9 fL (ref 78.0–100.0)
MONO ABS: 1.7 10*3/uL — AB (ref 0.1–1.0)
Monocytes Relative: 9 %
Neutro Abs: 14.4 10*3/uL — ABNORMAL HIGH (ref 1.7–7.7)
Neutrophils Relative %: 75 %
PLATELETS: 350 10*3/uL (ref 150–400)
RBC: 4.67 MIL/uL (ref 3.87–5.11)
RDW: 16.3 % — AB (ref 11.5–15.5)
WBC: 19.1 10*3/uL — AB (ref 4.0–10.5)

## 2017-05-31 LAB — BASIC METABOLIC PANEL
ANION GAP: 16 — AB (ref 5–15)
BUN: <5 mg/dL — ABNORMAL LOW (ref 6–20)
CO2: 25 mmol/L (ref 22–32)
Calcium: 8.2 mg/dL — ABNORMAL LOW (ref 8.9–10.3)
Chloride: 95 mmol/L — ABNORMAL LOW (ref 101–111)
Creatinine, Ser: 0.49 mg/dL (ref 0.44–1.00)
Glucose, Bld: 218 mg/dL — ABNORMAL HIGH (ref 65–99)
Potassium: 2.9 mmol/L — ABNORMAL LOW (ref 3.5–5.1)
SODIUM: 136 mmol/L (ref 135–145)

## 2017-05-31 MED ORDER — IBUPROFEN 200 MG PO TABS
600.0000 mg | ORAL_TABLET | Freq: Four times a day (QID) | ORAL | Status: DC | PRN
  Administered 2017-05-31 (×2): 600 mg via ORAL
  Filled 2017-05-31 (×2): qty 3

## 2017-05-31 MED ORDER — POTASSIUM CHLORIDE CRYS ER 20 MEQ PO TBCR
40.0000 meq | EXTENDED_RELEASE_TABLET | Freq: Two times a day (BID) | ORAL | Status: DC
  Administered 2017-05-31: 40 meq via ORAL
  Filled 2017-05-31: qty 2

## 2017-05-31 MED ORDER — POTASSIUM CHLORIDE 10 MEQ/100ML IV SOLN
10.0000 meq | INTRAVENOUS | Status: DC
  Filled 2017-05-31 (×3): qty 100

## 2017-05-31 MED ORDER — OXYCODONE-ACETAMINOPHEN 7.5-325 MG PO TABS
2.0000 | ORAL_TABLET | ORAL | Status: DC | PRN
  Administered 2017-05-31: 2 via ORAL
  Filled 2017-05-31: qty 2

## 2017-05-31 MED ORDER — CLINDAMYCIN HCL 300 MG PO CAPS
300.0000 mg | ORAL_CAPSULE | Freq: Three times a day (TID) | ORAL | Status: DC
  Administered 2017-05-31 – 2017-06-03 (×9): 300 mg via ORAL
  Filled 2017-05-31 (×9): qty 1

## 2017-05-31 MED ORDER — HYDROCODONE-ACETAMINOPHEN 10-325 MG PO TABS
1.0000 | ORAL_TABLET | Freq: Four times a day (QID) | ORAL | Status: DC | PRN
  Administered 2017-05-31 – 2017-06-01 (×2): 1 via ORAL
  Filled 2017-05-31 (×2): qty 1

## 2017-05-31 MED ORDER — POTASSIUM CHLORIDE 10 MEQ/50ML IV SOLN: 10.0000 meq | INTRAVENOUS | Status: DC

## 2017-05-31 MED ORDER — POTASSIUM CHLORIDE CRYS ER 20 MEQ PO TBCR
40.0000 meq | EXTENDED_RELEASE_TABLET | Freq: Three times a day (TID) | ORAL | Status: DC
  Administered 2017-05-31 – 2017-06-03 (×7): 40 meq via ORAL
  Filled 2017-05-31 (×7): qty 2

## 2017-05-31 NOTE — Consult Note (Signed)
Reason for Consult: jaw swelling  Ashley Donaldson is an 42 y.o. female.  GL:OVFIE jaw swelling   HPI: Pt underwent root canal therapy approx 2 wks ago. Began developing swelling and placed on abx. Swelling persisting causing difficulty swallowing, eating, chewing.   Past Medical History:  Diagnosis Date  . Anxiety   . Hypothyroidism   . Obesity   . Periapical abscess with facial involvement 05/31/2027    Past Surgical History:  Procedure Laterality Date  . COLON SURGERY  1994  . ROOT CANAL      History reviewed. No pertinent family history.  Social History:  reports that she has been smoking cigarettes.  She has a 2.50 pack-year smoking history. she has never used smokeless tobacco. She reports that she does not drink alcohol or use drugs.  Allergies:  Allergies  Allergen Reactions  . Methylpyrrolidone Shortness Of Breath    Reported by [facility] 04/24/14 - pt not aware of this allergy  . Ciprofloxacin Rash  . Omni-Pac Rash    "cefdinir"    Medications: I have reviewed the patient's current medications.  Results for orders placed or performed during the hospital encounter of 05/28/17 (from the past 48 hour(s))  CBC with Differential/Platelet     Status: Abnormal   Collection Time: 05/31/17  8:49 AM  Result Value Ref Range   WBC 19.1 (H) 4.0 - 10.5 K/uL   RBC 4.67 3.87 - 5.11 MIL/uL   Hemoglobin 12.2 12.0 - 15.0 g/dL   HCT 39.2 36.0 - 46.0 %   MCV 83.9 78.0 - 100.0 fL   MCH 26.1 26.0 - 34.0 pg   MCHC 31.1 30.0 - 36.0 g/dL   RDW 16.3 (H) 11.5 - 15.5 %   Platelets 350 150 - 400 K/uL   Neutrophils Relative % 75 %   Neutro Abs 14.4 (H) 1.7 - 7.7 K/uL   Lymphocytes Relative 15 %   Lymphs Abs 2.8 0.7 - 4.0 K/uL   Monocytes Relative 9 %   Monocytes Absolute 1.7 (H) 0.1 - 1.0 K/uL   Eosinophils Relative 1 %   Eosinophils Absolute 0.2 0.0 - 0.7 K/uL   Basophils Relative 0 %   Basophils Absolute 0.0 0.0 - 0.1 K/uL    Comment: Performed at [facility],  [address], [place], [place] [address]  Basic metabolic panel     Status: Abnormal   Collection Time: 05/31/17  8:49 AM  Result Value Ref Range   Sodium 136 135 - 145 mmol/L   Potassium 2.9 (L) 3.5 - 5.1 mmol/L   Chloride 95 (L) 101 - 111 mmol/L   CO2 25 22 - 32 mmol/L   Glucose, Bld 218 (H) 65 - 99 mg/dL   BUN <5 (L) 6 - 20 mg/dL   Creatinine, Ser 0.49 0.44 - 1.00 mg/dL   Calcium 8.2 (L) 8.9 - 10.3 mg/dL   GFR calc non Af Amer >60 >60 mL/min   GFR calc Af Amer >60 >60 mL/min    Comment: (NOTE) The eGFR has been calculated using the CKD EPI equation. This calculation has not been validated in all clinical situations. eGFR's persistently <60 mL/min signify possible Chronic Kidney Disease.    Anion gap 16 (H) 5 - 15    Comment: Performed at [facility], [place] [address] [place], [place] [address]    No results found.  ROS Blood pressure (!) 148/94, pulse 85, temperature 98.7 F (37.1 C), temperature source Oral, resp. rate 18, height  5' 1"  (1.549 m), weight 240 lb (108.9 kg), last menstrual period 04/28/2017, SpO2 100 %. General appearance: alert, cooperative, no distress and morbidly obese Head: Normocephalic, without obvious abnormality, atraumatic Eyes: negative Nose: Nares normal. Septum midline. Mucosa normal. No drainage or sinus tenderness. Throat: trismus to 52m. Pharynx clear. Mild to moderate FOM swelling right. #31  slightly mobile. Neck: no adenopathy, supple, symmetrical, trachea midline, thyroid not enlarged, symmetric, no tenderness/mass/nodules and moderate firm swelling right submandibular/sublingual space  Assessment/Plan: Right sublingual/submandibular space infection s/p Root canal tooth #31. Nonrestorable tooth # 31. Plan I & D with extraction in am. General anesthesia.   SDiona Browner2/07/2017, 4:21 PM

## 2017-05-31 NOTE — Progress Notes (Signed)
[facility] PROGRESS NOTE  Ashley Donaldson ZOX:[id number] DOB: [birth year] DOA: 05/28/2017 PCP: System, Pcp Not In  Assessment/Plan:  Periapical abscess: Acute.  Patient presents with complaints of right lower jaw swelling and pain after root canal 10 days ago despite being on antibiotics of amoxicillin and subsequently Augmentin.  Found to have - trasnition clindamycin IV to PO clinda 300 q8 - Hydrocodone prn pain--->dilaudid--adding Percocet and Ibuprofen to aid with pain control as she is still in severe pain with difficulty swallowing and not ready for d/c - Continue prednisone per taper - Rediscussed with Dr. Viviann SpareSteven Donaldson 2/4 and noted that the area is draining per patient-  Have been advised by husband who went to his office that Oral surgeyr might need ot be involved-consulted Dr. Barbette Donaldson who will eval later today  Leukocytosis: Acute.  WBC elevated to 21.2-->17.8 Suspect secondary to infection as seen above. - Recheck CBC is pending  Steroid-induced hyperglycemia: Initial blood glucose elevated 183, but patient currently on steroid taper. - Continue to monitor and place on insulin if needed  Hypothyroidism - Continue levothyroxine  Hypokalemia: acute.  Initial potassium noted to be 3.2 - cannot tolerate Iv replacement-place on 40 Tid, labs am  DVT prophylaxis: lovenox  Code Status: Full  Family Communication: none present Disposition Plan: Discharge home in 1-2 days Consults called: Oral Surgery Admission status: inpt    Consultants:  Dentistry as above  Procedures:  ---  Antibiotics:  Cleocin 2/2 -->P  HPI/Subjective:  limited ROM to R jaw Some discomfort and doesn't discernibly think she is much better  emotional   Objective: Vitals:   05/30/17 2056 05/31/17 0548  BP: (!) 143/94 (!) 148/94  Pulse: 86 85  Resp: 18 18  Temp: 98.7 F (37.1 C) 98.7 F (37.1 C)  SpO2: 100% 100%   No intake or output data in the 24 hours ending 05/31/17  0949 Filed Weights   05/28/17 1537  Weight: 108.9 kg (240 lb)    Exam:   General:  Ashley Donaldson, R facial swelling submental on L and at angle of jaw, no external drainage--poor exam of teeth at back of throat  Cardiovascular: RRR, no MRG  Respiratory: CTAB, nl wob  Abdomen: BS+, ND  Musculoskeletal: moving all extr   Data Reviewed: Basic Metabolic Panel: Recent Labs  Lab 05/28/17 1544 05/29/17 0903  NA 140 137  K 3.2* 3.6  CL 106 102  CO2 22 24  GLUCOSE 183* 247*  BUN 12 7  CREATININE 0.69 0.62  CALCIUM 8.9 8.6*   Liver Function Tests: Recent Labs  Lab 05/28/17 1544  AST 21  ALT 11*  ALKPHOS 80  BILITOT 0.5  PROT 6.2*  ALBUMIN 3.3*   No results for input(s): LIPASE, AMYLASE in the last 168 hours. No results for input(s): AMMONIA in the last 168 hours. CBC: Recent Labs  Lab 05/28/17 1544 05/29/17 0903  WBC 21.2* 17.8*  NEUTROABS 15.3*  --   HGB 13.2 12.0  HCT 42.5 38.3  MCV 83.3 84.2  PLT 451* 394   Cardiac Enzymes: No results for input(s): CKTOTAL, CKMB, CKMBINDEX, TROPONINI in the last 168 hours. BNP (last 3 results) No results for input(s): BNP in the last 8760 hours.  ProBNP (last 3 results) No results for input(s): PROBNP in the last 8760 hours.  CBG: No results for input(s): GLUCAP in the last 168 hours.  No results found for this or any previous visit (from the past 240 hour(s)).   Studies: No results found.  Scheduled  Meds: . clindamycin  300 mg Oral Q8H  . enoxaparin (LOVENOX) injection  40 mg Subcutaneous Daily  . levothyroxine  175 mcg Oral QAC breakfast   Continuous Infusions:   Principal Problem:   Ludwig's angina Active Problems:   Periapical abscess   Hypothyroidism   Steroid-induced hyperglycemia   Leukocytosis    Time spent: 25

## 2017-06-01 ENCOUNTER — Inpatient Hospital Stay (HOSPITAL_COMMUNITY): Payer: BLUE CROSS/BLUE SHIELD | Admitting: Anesthesiology

## 2017-06-01 ENCOUNTER — Encounter (HOSPITAL_COMMUNITY)
Admission: EM | Disposition: A | Discharge status: Home / Self Care | Payer: Self-pay | Source: Home / Self Care | Attending: Family Medicine

## 2017-06-01 HISTORY — PX: MULTIPLE EXTRACTIONS WITH ALVEOLOPLASTY: SHX5342

## 2017-06-01 LAB — RENAL FUNCTION PANEL
Albumin: 2.5 g/dL — ABNORMAL LOW (ref 3.5–5.0)
Anion gap: 11 (ref 5–15)
BUN: 5 mg/dL — ABNORMAL LOW (ref 6–20)
CO2: 26 mmol/L (ref 22–32)
Calcium: 8.6 mg/dL — ABNORMAL LOW (ref 8.9–10.3)
Chloride: 103 mmol/L (ref 101–111)
Creatinine, Ser: 0.54 mg/dL (ref 0.44–1.00)
GFR calc Af Amer: >60 mL/min (ref >60)
GFR calc non Af Amer: >60 mL/min (ref >60)
Glucose, Bld: 152 mg/dL — ABNORMAL HIGH (ref 65–99)
Phosphorus: 3.2 mg/dL (ref 2.5–4.6)
Potassium: 4 mmol/L (ref 3.5–5.1)
Sodium: 140 mmol/L (ref 135–145)

## 2017-06-01 LAB — CBC WITH DIFFERENTIAL/PLATELET
Basophils Absolute: 0 10*3/uL (ref 0.0–0.1)
Basophils Relative: 0 %
Eosinophils Absolute: 0.2 10*3/uL (ref 0.0–0.7)
Eosinophils Relative: 3 %
HEMATOCRIT: 38.2 % (ref 36.0–46.0)
HEMOGLOBIN: 11.8 g/dL — AB (ref 12.0–15.0)
LYMPHS ABS: 3.3 10*3/uL (ref 0.7–4.0)
LYMPHS PCT: 36 %
MCH: 26 pg (ref 26.0–34.0)
MCHC: 30.9 g/dL (ref 30.0–36.0)
MCV: 84.3 fL (ref 78.0–100.0)
MONO ABS: 1 10*3/uL (ref 0.1–1.0)
MONOS PCT: 11 %
NEUTROS ABS: 4.6 10*3/uL (ref 1.7–7.7)
Neutrophils Relative %: 50 %
Platelets: 348 10*3/uL (ref 150–400)
RBC: 4.53 MIL/uL (ref 3.87–5.11)
RDW: 16.4 % — AB (ref 11.5–15.5)
WBC: 9 10*3/uL (ref 4.0–10.5)

## 2017-06-01 LAB — SURGICAL PCR SCREEN
MRSA, PCR: NEGATIVE
Staphylococcus aureus: NEGATIVE

## 2017-06-01 SURGERY — MULTIPLE EXTRACTION WITH ALVEOLOPLASTY
Anesthesia: General | Site: Mouth

## 2017-06-01 MED ORDER — LACTATED RINGERS IV SOLN
INTRAVENOUS | Status: DC
  Administered 2017-06-01: 10:00:00 via INTRAVENOUS

## 2017-06-01 MED ORDER — DEXAMETHASONE SODIUM PHOSPHATE 10 MG/ML IJ SOLN
INTRAMUSCULAR | Status: DC | PRN
  Administered 2017-06-01: 10 mg via INTRAVENOUS

## 2017-06-01 MED ORDER — ONDANSETRON HCL 4 MG/2ML IJ SOLN
INTRAMUSCULAR | Status: DC | PRN
  Administered 2017-06-01: 4 mg via INTRAVENOUS

## 2017-06-01 MED ORDER — HYDROMORPHONE HCL 1 MG/ML IJ SOLN
1.0000 mg | INTRAMUSCULAR | Status: DC | PRN
  Administered 2017-06-01 – 2017-06-03 (×6): 1 mg via INTRAVENOUS
  Filled 2017-06-01 (×6): qty 1

## 2017-06-01 MED ORDER — HYDROCODONE-ACETAMINOPHEN 5-325 MG PO TABS
1.0000 | ORAL_TABLET | ORAL | Status: DC | PRN
  Administered 2017-06-01 – 2017-06-02 (×3): 2 via ORAL
  Filled 2017-06-01 (×4): qty 2

## 2017-06-01 MED ORDER — OXYCODONE HCL 5 MG PO TABS: 5.0000 mg | ORAL_TABLET | Freq: Once | ORAL | Status: DC | PRN

## 2017-06-01 MED ORDER — OXYCODONE HCL 5 MG/5ML PO SOLN: 5.0000 mg | Freq: Once | ORAL | Status: DC | PRN

## 2017-06-01 MED ORDER — LIDOCAINE-EPINEPHRINE 2 %-1:100000 IJ SOLN
INTRAMUSCULAR | Status: DC | PRN
  Administered 2017-06-01: 18 mL via INTRADERMAL

## 2017-06-01 MED ORDER — LIDOCAINE-EPINEPHRINE 2 %-1:100000 IJ SOLN
INTRAMUSCULAR | Status: AC
  Filled 2017-06-01: qty 1

## 2017-06-01 MED ORDER — FENTANYL CITRATE (PF) 250 MCG/5ML IJ SOLN
INTRAMUSCULAR | Status: DC | PRN
  Administered 2017-06-01: 50 ug via INTRAVENOUS
  Administered 2017-06-01: 100 ug via INTRAVENOUS

## 2017-06-01 MED ORDER — FENTANYL CITRATE (PF) 100 MCG/2ML IJ SOLN
INTRAMUSCULAR | Status: AC
  Filled 2017-06-01: qty 2

## 2017-06-01 MED ORDER — MIDAZOLAM HCL 5 MG/5ML IJ SOLN
INTRAMUSCULAR | Status: DC | PRN
  Administered 2017-06-01: 2 mg via INTRAVENOUS

## 2017-06-01 MED ORDER — DEXTROSE-NACL 5-0.45 % IV SOLN
INTRAVENOUS | Status: DC
  Administered 2017-06-01 – 2017-06-02 (×3): via INTRAVENOUS

## 2017-06-01 MED ORDER — SODIUM CHLORIDE 0.9 % IR SOLN
Status: DC | PRN
  Administered 2017-06-01: 1

## 2017-06-01 MED ORDER — 0.9 % SODIUM CHLORIDE (POUR BTL) OPTIME
TOPICAL | Status: DC | PRN
  Administered 2017-06-01: 1000 mL

## 2017-06-01 MED ORDER — OXYMETAZOLINE HCL 0.05 % NA SOLN
NASAL | Status: AC
  Filled 2017-06-01: qty 15

## 2017-06-01 MED ORDER — METOPROLOL TARTRATE 5 MG/5ML IV SOLN: 5.0000 mg | Freq: Four times a day (QID) | INTRAVENOUS | Status: DC | PRN

## 2017-06-01 MED ORDER — FENTANYL CITRATE (PF) 100 MCG/2ML IJ SOLN
25.0000 ug | INTRAMUSCULAR | Status: DC | PRN
  Administered 2017-06-01 (×2): 50 ug via INTRAVENOUS

## 2017-06-01 MED ORDER — SUCCINYLCHOLINE CHLORIDE 20 MG/ML IJ SOLN
INTRAMUSCULAR | Status: DC | PRN
  Administered 2017-06-01: 100 mg via INTRAVENOUS

## 2017-06-01 MED ORDER — PROPOFOL 10 MG/ML IV BOLUS
INTRAVENOUS | Status: DC | PRN
  Administered 2017-06-01 (×2): 40 mg via INTRAVENOUS
  Administered 2017-06-01: 20 mg via INTRAVENOUS
  Administered 2017-06-01: 100 mg via INTRAVENOUS

## 2017-06-01 MED ORDER — DIPHENHYDRAMINE HCL 50 MG/ML IJ SOLN: 25.0000 mg | Freq: Four times a day (QID) | INTRAMUSCULAR | Status: DC | PRN

## 2017-06-01 MED ORDER — FENTANYL CITRATE (PF) 250 MCG/5ML IJ SOLN
INTRAMUSCULAR | Status: AC
  Filled 2017-06-01: qty 5

## 2017-06-01 MED ORDER — LIDOCAINE HCL (CARDIAC) 20 MG/ML IV SOLN
INTRAVENOUS | Status: DC | PRN
  Administered 2017-06-01: 60 mg via INTRATRACHEAL

## 2017-06-01 MED ORDER — MIDAZOLAM HCL 2 MG/2ML IJ SOLN
INTRAMUSCULAR | Status: AC
  Filled 2017-06-01: qty 2

## 2017-06-01 MED ORDER — DIPHENHYDRAMINE HCL 25 MG PO CAPS: 25.0000 mg | ORAL_CAPSULE | Freq: Four times a day (QID) | ORAL | Status: DC | PRN

## 2017-06-01 SURGICAL SUPPLY — 36 items
BUR CROSS CUT FISSURE 1.6 (BURR) ×2 IMPLANT
BUR CROSS CUT FISSURE 1.6MM (BURR) ×1
BUR EGG ELITE 4.0 (BURR) ×2 IMPLANT
BUR EGG ELITE 4.0MM (BURR) ×1
CANISTER SUCT 3000ML PPV (MISCELLANEOUS) ×3 IMPLANT
COVER SURGICAL LIGHT HANDLE (MISCELLANEOUS) ×3 IMPLANT
CRADLE DONUT ADULT HEAD (MISCELLANEOUS) ×3 IMPLANT
DECANTER SPIKE VIAL GLASS SM (MISCELLANEOUS) IMPLANT
DRAPE U-SHAPE 76X120 STRL (DRAPES) ×3 IMPLANT
FLUID NSS /IRRIG 1000 ML XXX (MISCELLANEOUS) ×3 IMPLANT
GAUZE PACKING FOLDED 2  STR (GAUZE/BANDAGES/DRESSINGS) ×2
GAUZE PACKING FOLDED 2 STR (GAUZE/BANDAGES/DRESSINGS) ×1 IMPLANT
GAUZE SPONGE 4X4 12PLY STRL LF (GAUZE/BANDAGES/DRESSINGS) ×3 IMPLANT
GLOVE BIO SURGEON STRL SZ 6.5 (GLOVE) ×2 IMPLANT
GLOVE BIO SURGEON STRL SZ7.5 (GLOVE) ×3 IMPLANT
GLOVE BIO SURGEONS STRL SZ 6.5 (GLOVE) ×1
GLOVE BIOGEL PI IND STRL 7.0 (GLOVE) IMPLANT
GLOVE BIOGEL PI INDICATOR 7.0 (GLOVE)
GOWN STRL REUS W/ TWL LRG LVL3 (GOWN DISPOSABLE) ×1 IMPLANT
GOWN STRL REUS W/ TWL XL LVL3 (GOWN DISPOSABLE) ×1 IMPLANT
GOWN STRL REUS W/TWL LRG LVL3 (GOWN DISPOSABLE) ×2
GOWN STRL REUS W/TWL XL LVL3 (GOWN DISPOSABLE) ×2
KIT BASIN OR (CUSTOM PROCEDURE TRAY) ×3 IMPLANT
KIT ROOM TURNOVER OR (KITS) ×3 IMPLANT
NEEDLE 22X1 1/2 (OR ONLY) (NEEDLE) ×6 IMPLANT
NEEDLE 27GAX1X1/2 (NEEDLE) IMPLANT
NS IRRIG 1000ML POUR BTL (IV SOLUTION) ×3 IMPLANT
PAD ARMBOARD 7.5X6 YLW CONV (MISCELLANEOUS) ×3 IMPLANT
SUT CHROMIC 3 0 PS 2 (SUTURE) ×3 IMPLANT
SWAB CULTURE LIQ STUART DBL (MISCELLANEOUS) ×3 IMPLANT
SWAB CULTURE LIQUID MINI MALE (MISCELLANEOUS) ×3 IMPLANT
SYR CONTROL 10ML LL (SYRINGE) ×3 IMPLANT
TAPE CLOTH SURG 4X10 WHT LF (GAUZE/BANDAGES/DRESSINGS) ×3 IMPLANT
TRAY ENT MC OR (CUSTOM PROCEDURE TRAY) ×3 IMPLANT
TUBING IRRIGATION (MISCELLANEOUS) ×3 IMPLANT
YANKAUER SUCT BULB TIP NO VENT (SUCTIONS) ×3 IMPLANT

## 2017-06-01 NOTE — Anesthesia Preprocedure Evaluation (Signed)
Anesthesia Evaluation  Patient identified by MRN, date of birth, ID band Patient awake    Reviewed: Allergy & Precautions, NPO status , Patient's Chart, lab work & pertinent test results  History of Anesthesia Complications Negative for: history of anesthetic complications  Airway Mallampati: IV   Neck ROM: Full  Mouth opening: Limited Mouth Opening  Dental  (+) Teeth Intact   Pulmonary neg shortness of breath, neg COPD, neg recent URI, Current Smoker,    breath sounds clear to auscultation       Cardiovascular negative cardio ROS   Rhythm:Regular     Neuro/Psych PSYCHIATRIC DISORDERS Anxiety negative neurological ROS     GI/Hepatic negative GI ROS, Neg liver ROS,   Endo/Other  Hypothyroidism Morbid obesityElevated glucose  Renal/GU negative Renal ROS     Musculoskeletal   Abdominal   Peds  Hematology   Anesthesia Other Findings   Reproductive/Obstetrics                             Anesthesia Physical Anesthesia Plan  ASA: III  Anesthesia Plan: General   Post-op Pain Management:    Induction: Intravenous  PONV Risk Score and Plan: 2 and Ondansetron and Dexamethasone  Airway Management Planned: Nasal ETT, Oral ETT, Video Laryngoscope Planned and Fiberoptic Intubation Planned  Additional Equipment: None  Intra-op Plan:   Post-operative Plan: Extubation in OR  Informed Consent: I have reviewed the patients History and Physical, chart, labs and discussed the procedure including the risks, benefits and alternatives for the proposed anesthesia with the patient or authorized representative who has indicated his/her understanding and acceptance.   Dental advisory given  Plan Discussed with: CRNA and Surgeon  Anesthesia Plan Comments: (Limited mouth opening on exam, will sedate and determine mouth opening prior to induction, if remains limited will proceed with fiberoptic nasal  intubation)        Anesthesia Quick Evaluation

## 2017-06-01 NOTE — Transfer of Care (Signed)
Immediate Anesthesia Transfer of Care Note  Patient: Ashley Donaldson  Procedure(s) Performed: MULTIPLE EXTRACTION WITH IRRIGATION AND DEBRIDEMENT (N/A )  Patient Location: PACU  Anesthesia Type:General  Level of Consciousness: awake, alert  and oriented  Airway & Oxygen Therapy: Patient Spontanous Breathing and Patient connected to nasal cannula oxygen  Post-op Assessment: Report given to RN, Post -op Vital signs reviewed and stable and Patient moving all extremities X 4  Post vital signs: Reviewed and stable  Last Vitals:  Vitals:   06/01/17 1130 06/01/17 1131  BP: (!) 150/83   Pulse: 95   Resp: 11   Temp:  36.8 C  SpO2: 96%     Last Pain:  Vitals:   06/01/17 1131  TempSrc:   PainSc: 0-No pain      Patients Stated Pain Goal: 0 (06/01/17 0814)  Complications: No apparent anesthesia complications

## 2017-06-01 NOTE — Op Note (Signed)
05/28/2017 - 06/01/2017  11:21 AM  PATIENT:  Ashley Donaldson  42 y.o. female  PRE-OPERATIVE DIAGNOSIS:  Right Sublingual/Submandibular space infection. Nonrestorable tooth #31  POST-OPERATIVE DIAGNOSIS:  SAME  PROCEDURE:  Procedure(s):  EXTRACTION TOOTH #31, EXTRAORAL INCISION AND DRAINAGE RIGHT SUBMANDIBULAR/SUBLINGUAL SPACE INFECTION  SURGEON:  Surgeon(s): Ocie DoyneJensen, Braeden Kennan, DDS  ANESTHESIA:   local and general  EBL:  minimal  DRAINS: 1/4" PENROSE RIGHT SUBLINGUAL SPACE  SPECIMEN:  No Specimen  COUNTS:  YES  PLAN OF CARE: Discharge to home after PACU  PATIENT DISPOSITION:  PACU - hemodynamically stable.   PROCEDURE DETAILS: Dictation # [id number] Georgia LopesScott M. Qamar Aughenbaugh, DMD 06/01/2017 11:21 AM

## 2017-06-01 NOTE — H&P (Signed)
H&P documentation  -History and Physical Reviewed  -Patient has been re-examined  -No change in the plan of care  Ashley Donaldson  

## 2017-06-01 NOTE — Progress Notes (Signed)
[facility] PROGRESS NOTE  Elease EtienneChristina Dinunzio ZOX:[id number] DOB: 08/14/1975 DOA: 05/28/2017 PCP: System, Pcp Not In  Assessment/Plan:  Periapical abscess: Acute.  Patient presents with complaints of right lower jaw swelling and pain after root canal 10 days ago despite being on antibiotics of amoxicillin and subsequently Augmentin.  Found to have - trasnition clindamycin IV to PO clinda 300 q8 - Hydrocodone prn pain--->dilaudid--adding Percocet and Ibuprofen to aid with pain control as she is still in severe pain with difficulty swallowing and not ready for d/c - Continue prednisone per taper - Dr. Barbette MerinoJensen performing surgery today--defer to his clinical decision-making further planning  Leukocytosis: Acute.  WBC elevated to 21.2-->17.8 Suspect secondary to infection as seen above. - Recheck CBC is pending  Steroid-induced hyperglycemia: suagrs 152-218 and stable - Continue to monitor only--would not cover in acitve infeciton, steroids at this timeed  Hypothyroidism - Continue levothyroxine  Hypokalemia: acute.  Initial potassium noted to be 3.2 - cannot tolerate Iv replacement-place on 40 Tid, labs am  DVT prophylaxis: lovenox  Code Status: Full  Family Communication: none present Disposition Plan: Discharge home in 1-2 days Consults called: Oral Surgery Admission status: inpt    Consultants:  Dentistry as above  Procedures:  ---  Antibiotics:  Cleocin 2/2 -->P  HPI/Subjective:   abcess in mouth ruptured and is better she feels relief going to surgeyr this am for possible removal tooth   Objective: Vitals:   06/01/17 1215 06/01/17 1216  BP:  (!) 158/89  Pulse: 82 76  Resp:    Temp:  98.2 F (36.8 C)  SpO2:  94%    Intake/Output Summary (Last 24 hours) at 06/01/2017 1447 Last data filed at 06/01/2017 1125 Gross per 24 hour  Intake 1220 ml  Output 10 ml  Net 1210 ml   Filed Weights   05/28/17 1537  Weight: 108.9 kg (240 lb)     Exam:   General:  [place], R facial swelling submental on L and at angle of jaw, no external drainage--poor exam of teeth at back of throat  Cardiovascular: RRR, no MRG  Respiratory: CTAB, nl wob  Abdomen: BS+, ND  Musculoskeletal: moving all extr   Data Reviewed: Basic Metabolic Panel: Recent Labs  Lab 05/28/17 1544 05/29/17 0903 05/31/17 0849 06/01/17 0615  NA 140 137 136 140  K 3.2* 3.6 2.9* 4.0  CL 106 102 95* 103  CO2 22 24 25 26   GLUCOSE 183* 247* 218* 152*  BUN 12 7 <5* 5*  CREATININE 0.69 0.62 0.49 0.54  CALCIUM 8.9 8.6* 8.2* 8.6*  PHOS  --   --   --  3.2   Liver Function Tests: Recent Labs  Lab 05/28/17 1544 06/01/17 0615  AST 21  --   ALT 11*  --   ALKPHOS 80  --   BILITOT 0.5  --   PROT 6.2*  --   ALBUMIN 3.3* 2.5*   No results for input(s): LIPASE, AMYLASE in the last 168 hours. No results for input(s): AMMONIA in the last 168 hours. CBC: Recent Labs  Lab 05/28/17 1544 05/29/17 0903 05/31/17 0849 06/01/17 0615  WBC 21.2* 17.8* 19.1* 9.0  NEUTROABS 15.3*  --  14.4* 4.6  HGB 13.2 12.0 12.2 11.8*  HCT 42.5 38.3 39.2 38.2  MCV 83.3 84.2 83.9 84.3  PLT 451* 394 350 348   Cardiac Enzymes: No results for input(s): CKTOTAL, CKMB, CKMBINDEX, TROPONINI in the last 168 hours. BNP (last 3 results) No results for input(s): BNP in the  last 8760 hours.  ProBNP (last 3 results) No results for input(s): PROBNP in the last 8760 hours.  CBG: No results for input(s): GLUCAP in the last 168 hours.  Recent Results (from the past 240 hour(s))  Surgical pcr screen     Status: None   Collection Time: 05/31/17 10:16 PM  Result Value Ref Range Status   MRSA, PCR NEGATIVE NEGATIVE Final   Staphylococcus aureus NEGATIVE NEGATIVE Final    Comment: (NOTE) The Xpert SA Assay (FDA approved for NASAL specimens in patients 41 years of age and older), is one component of a comprehensive surveillance program. It is not intended to diagnose infection nor  to guide or monitor treatment. Performed at [facility], [address] [place], [place] [address]      Studies: No results found.  Scheduled Meds: . clindamycin  300 mg Oral Q8H  . enoxaparin (LOVENOX) injection  40 mg Subcutaneous Daily  . fentaNYL      . levothyroxine  175 mcg Oral QAC breakfast  . potassium chloride  40 mEq Oral TID   Continuous Infusions: . dextrose 5 % and 0.45% NaCl 100 mL/hr at 06/01/17 1225  . lactated ringers 50 mL/hr at 06/01/17 1017    Principal Problem:   Ludwig's angina Active Problems:   Periapical abscess   Hypothyroidism   Steroid-induced hyperglycemia   Leukocytosis    Time spent: 15

## 2017-06-01 NOTE — Op Note (Signed)
NAME:  Donaldson, Ashley                  ACCOUNT NO.:  MEDICAL RECORD NO.:  [id number]  LOCATION:                                 FACILITY:  PHYSICIAN:  Georgia LopesScott M. Reniah Cottingham, M.D.       DATE OF BIRTH:  DATE OF PROCEDURE:  06/01/2017 DATE OF DISCHARGE:                              OPERATIVE REPORT   PREOPERATIVE DIAGNOSES: 1. Right sublingual submandibular space infection. 2. Nonrestorable tooth #31.  POSTOPERATIVE DIAGNOSES: 1. Right sublingual submandibular space infection. 2. Nonrestorable tooth #31.  PROCEDURE: 1. Extraction of tooth #31. 2. Extraoral incision and drainage of right submandibular sublingual     space infection.  SURGEON:  Georgia LopesScott M. Pinkney Venard, MD.  ANESTHESIA:  General nasal intubation.  DESCRIPTION OF PROCEDURE:  The patient was taken to the operating room and placed on the table in supine position.  General anesthesia was administered intravenously and a nasal endotracheal tube was placed and secured.  The eyes were protected, and the patient was prepped and draped for surgery.  Time-out was performed.  The posterior pharynx was suctioned and a throat pack was placed.  A 2% lidocaine with 1:100,000 epinephrine was infiltrated in an inferior alveolar block and buccal and lingual infiltration around tooth #31.  Then, on the patient's neck, the neck swelling was palpated and the point of most pointed infection was in the anterior area of the mandible toward the sublingual area.  A 15 blade was used to make a 1-cm incision through skin and submucosal or subcutaneous tissue.  Then, a curved Kelly hemostat was used to track deeply until the inferior border of the mandible was identified and then the Tresa EndoKelly was used to track medially until the abscess was entered into. Then, some purulent exudate and serosanguineous fluid was evacuated. Cultures were taken aerobic and anaerobic.  Then, the area was irrigated and flushed and then a quarter-inch Penrose drain was placed  through the skin incision into the sublingual space and then sutured to the skin with 3-0 silk.  Then, in the oral cavity, the sweetheart retractor was used to retract the tongue and a periosteal elevator was used to reflect the periosteum around tooth #31.  The tooth was elevated with a 301 elevator and removed from the mouth with a dental forceps.  The socket was then curetted and irrigated.  No suture was required.  The oral cavity was irrigated and suctioned.  The throat pack was removed.  The patient was left in care of Anesthesia for extubation and transportation to recovery room with plans for transfer to the floor.  ESTIMATED BLOOD LOSS:  Minimal.  COMPLICATIONS:  None.  SPECIMENS:  None.  DRAIN:  Quarter-inch Penrose, right mandible, sublingual space.  CULTURES:  Aerobic and anaerobic cultures, right sublingual space.     Georgia LopesScott M. Dorien Bessent, M.D.     SMJ/MEDQ  D:  06/01/2017  T:  06/01/2017  Job:  [id number]

## 2017-06-01 NOTE — Anesthesia Procedure Notes (Signed)
Procedure Name: Intubation Date/Time: 06/01/2017 11:00 AM Performed by: Marena ChancyBeckner, Verlon Pischke S, CRNA Pre-anesthesia Checklist: Patient identified, Emergency Drugs available, Suction available and Patient being monitored Patient Re-evaluated:Patient Re-evaluated prior to induction Oxygen Delivery Method: Circle System Utilized Preoxygenation: Pre-oxygenation with 100% oxygen Induction Type: IV induction Ventilation: Mask ventilation without difficulty Laryngoscope Size: Miller and 2 Grade View: Grade II Nasal Tubes: Right, Nasal prep performed, Nasal Rae and Magill forceps- large, utilized Tube size: 7.0 mm Number of attempts: 1 Airway Equipment and Method: Stylet and Oral airway Placement Confirmation: ETT inserted through vocal cords under direct vision,  positive ETCO2 and breath sounds checked- equal and bilateral Tube secured with: Tape Dental Injury: Teeth and Oropharynx as per pre-operative assessment

## 2017-06-02 ENCOUNTER — Encounter (HOSPITAL_COMMUNITY): Payer: Self-pay | Admitting: Oral Surgery

## 2017-06-02 MED ORDER — FLUCONAZOLE 150 MG PO TABS
150.0000 mg | ORAL_TABLET | Freq: Once | ORAL | Status: AC
  Administered 2017-06-02: 150 mg via ORAL
  Filled 2017-06-02: qty 1

## 2017-06-02 MED ORDER — HYDROCODONE-ACETAMINOPHEN 5-325 MG PO TABS
1.0000 | ORAL_TABLET | ORAL | Status: DC
  Administered 2017-06-02: 1 via ORAL
  Administered 2017-06-02 – 2017-06-03 (×4): 2 via ORAL
  Administered 2017-06-03: 1 via ORAL
  Administered 2017-06-03: 2 via ORAL
  Administered 2017-06-03: 1 via ORAL
  Administered 2017-06-03 – 2017-06-04 (×2): 2 via ORAL
  Administered 2017-06-04: 1 via ORAL
  Administered 2017-06-04: 2 via ORAL
  Filled 2017-06-02 (×3): qty 2
  Filled 2017-06-02: qty 1
  Filled 2017-06-02 (×3): qty 2
  Filled 2017-06-02: qty 1
  Filled 2017-06-02: qty 2
  Filled 2017-06-02: qty 1
  Filled 2017-06-02: qty 2

## 2017-06-02 MED ORDER — IBUPROFEN 200 MG PO TABS
600.0000 mg | ORAL_TABLET | Freq: Four times a day (QID) | ORAL | Status: DC
  Administered 2017-06-02 – 2017-06-04 (×9): 600 mg via ORAL
  Filled 2017-06-02 (×9): qty 3

## 2017-06-02 NOTE — Progress Notes (Addendum)
[facility] PROGRESS NOTE  Ashley Donaldson [id number] DOB: Jan 09, 1976 DOA: 05/28/2017 PCP: System, Pcp Not In  Assessment/Plan:  Periapical abscess: Acute.    Persisting right lower jaw swelling and pain after root canal 10 days ago despite being on antibiotics of amoxicillin and subsequently Augmentin.  Abscess was debrided --2/5 by Dr. [name]--pain meds ibuprofen every 6, Norco scheduled every 4 and Dilaudid for breakthrough pain is not controlled monitoring  - trasnition clindamycin IV to PO clinda 300 q8 -Await culture data to ensure that covered  Leukocytosis: Acute.  WBC elevated to 21.2-->17.8 and improved to 9.0 on 2/5 we will repeat labs in a.m.--see above discussion  Steroid-induced hyperglycemia: suagrs 152-218 and stable - Continue to monitor only--would not cover in acitve infeciton, steroids at this timeed  Hypothyroidism - Continue levothyroxine  Hypokalemia: acute.  Initial potassium noted to be 3.2 - cannot tolerate Iv replacement-place on 40 Tid, to be repeated on 2/7   vulvovaginal candidiasis Probably secondary to use of steroids in addition to broad-spectrum antibiotics Giving 1 dose of Diflucan 150 on 2/6   Full code, Lovenox, inpatient, dispo pending   Consultants:  Dentistry as above  Procedures:  I&D and removal of tooth #31 2/5  Antibiotics:  Cleocin 2/2 -->P  HPI/Subjective:   8/10 pain currently Able to eat open mouth better however  Objective: Vitals:   06/02/17 0127 06/02/17 0600  BP: 118/73 (!) 141/80  Pulse: 74 66  Resp: 16 16  Temp: 98.1 F (36.7 C) 98.5 F (36.9 C)  SpO2: 99% 94%    Intake/Output Summary (Last 24 hours) at 06/02/2017 0908 Last data filed at 06/02/2017 0650 Gross per 24 hour  Intake 1940 ml  Output 310 ml  Net 1630 ml   Filed Weights   05/28/17 1537  Weight: 108.9 kg (240 lb)    Exam:  Awake alert no pallor no ict, EOMI ncat, pleasant alert oriented s1 s2 no mrg Chest clear without  rales nor rhonchi, trachea midline Bandages under right jaw which were not uncovered abd soft nt nd no rebound no guard Neuro intact moving 4 limbs equally-no deficit and power 5/5, reflexes not tested sensory wnl Skin no rash no redness    Data Reviewed: Basic Metabolic Panel: Recent Labs  Lab 05/28/17 1544 05/29/17 0903 05/31/17 0849 06/01/17 0615  NA 140 137 136 140  K 3.2* 3.6 2.9* 4.0  CL 106 102 95* 103  CO2 22 24 25 26   GLUCOSE 183* 247* 218* 152*  BUN 12 7 <5* 5*  CREATININE 0.69 0.62 0.49 0.54  CALCIUM 8.9 8.6* 8.2* 8.6*  PHOS  --   --   --  3.2   Liver Function Tests: Recent Labs  Lab 05/28/17 1544 06/01/17 0615  AST 21  --   ALT 11*  --   ALKPHOS 80  --   BILITOT 0.5  --   PROT 6.2*  --   ALBUMIN 3.3* 2.5*   No results for input(s): LIPASE, AMYLASE in the last 168 hours. No results for input(s): AMMONIA in the last 168 hours. CBC: Recent Labs  Lab 05/28/17 1544 05/29/17 0903 05/31/17 0849 06/01/17 0615  WBC 21.2* 17.8* 19.1* 9.0  NEUTROABS 15.3*  --  14.4* 4.6  HGB 13.2 12.0 12.2 11.8*  HCT 42.5 38.3 39.2 38.2  MCV 83.3 84.2 83.9 84.3  PLT 451* 394 350 348   Cardiac Enzymes: No results for input(s): CKTOTAL, CKMB, CKMBINDEX, TROPONINI in the last 168 hours. BNP (last 3 results) No  results for input(s): BNP in the last 8760 hours.  ProBNP (last 3 results) No results for input(s): PROBNP in the last 8760 hours.  CBG: No results for input(s): GLUCAP in the last 168 hours.  Recent Results (from the past 240 hour(s))  Surgical pcr screen     Status: None   Collection Time: 05/31/17 10:16 PM  Result Value Ref Range Status   MRSA, PCR NEGATIVE NEGATIVE Final   Staphylococcus aureus NEGATIVE NEGATIVE Final    Comment: (NOTE) The Xpert SA Assay (FDA approved for NASAL specimens in patients 20 years of age and older), is one component of a comprehensive surveillance program. It is not intended to diagnose infection nor to guide or monitor  treatment. Performed at [facility], [address] [place], [place] [address]   Aerobic/Anaerobic Culture (surgical/deep wound)     Status: None (Preliminary result)   Collection Time: 06/01/17 11:29 AM  Result Value Ref Range Status   Specimen Description ABSCESS MOUTH  Final   Special Requests NONE  Final   Gram Stain   Final    RARE WBC PRESENT, PREDOMINANTLY PMN NO ORGANISMS SEEN Performed at [facility], [address], [place], [place] [address]    Culture PENDING  Incomplete   Report Status PENDING  Incomplete     Studies: No results found.  Scheduled Meds: . clindamycin  300 mg Oral Q8H  . enoxaparin (LOVENOX) injection  40 mg Subcutaneous Daily  . fluconazole  150 mg Oral Once  . HYDROcodone-acetaminophen  1-2 tablet Oral Q4H  . ibuprofen  600 mg Oral Q6H  . levothyroxine  175 mcg Oral QAC breakfast  . potassium chloride  40 mEq Oral TID   Continuous Infusions: . dextrose 5 % and 0.45% NaCl 100 mL/hr at 06/01/17 2219  . lactated ringers 50 mL/hr at 06/01/17 1017    Principal Problem:   Ludwig's angina Active Problems:   Periapical abscess   Hypothyroidism   Steroid-induced hyperglycemia   Leukocytosis    Time spent: 15

## 2017-06-02 NOTE — Progress Notes (Signed)
Ashley Donaldson PROGRESS NOTE:   SUBJECTIVE: Feeling better. Had drainage/dressing change. Ambulating well, Eating well. OBJECTIVE:  Vitals: Blood pressure (!) 141/80, pulse 66, temperature 98.5 F (36.9 C), temperature source Oral, resp. rate 16, height 5\' 1"  (1.549 m), weight 240 lb (108.9 kg), last menstrual period 04/28/2017, SpO2 94 %.   Lab results:No results found for this or any previous visit (from the past 24 hour(s)).  Radiology Results: No results found.   General appearance: alert, cooperative and no distress Head: Normocephalic, without obvious abnormality, atraumatic Eyes: negative Nose: Nares normal. Septum midline. Mucosa normal. No drainage or sinus tenderness. Throat: lips, mucosa, and tongue normal; teeth and gums normal and hemostatic extraction socket right mandible. No trismus. Pharynx clear. Neck: no adenopathy, supple, symmetrical, trachea midline and Moderate induration right submandibular/sublingual area with erythema. Drain intact.   Cultures: Pending / Gram stain: RARE WBC PRESENT, PREDOMINANTLY PMN  NO ORGANISMS SEEN   ASSESSMENT:s/p I and D right submandibular space with extraction of tooth.Would like to see decrease in induration/erythema prior to d/c.   PLAN: Continue antibiotics.    Ocie DoyneScott Simona Donaldson 06/02/2017

## 2017-06-03 LAB — CBC WITH DIFFERENTIAL/PLATELET
BASOS PCT: 0 %
Basophils Absolute: 0 10*3/uL (ref 0.0–0.1)
Eosinophils Absolute: 0.2 10*3/uL (ref 0.0–0.7)
Eosinophils Relative: 2 %
HEMATOCRIT: 37.6 % (ref 36.0–46.0)
HEMOGLOBIN: 11.2 g/dL — AB (ref 12.0–15.0)
LYMPHS ABS: 4.7 10*3/uL — AB (ref 0.7–4.0)
Lymphocytes Relative: 51 %
MCH: 25.6 pg — AB (ref 26.0–34.0)
MCHC: 29.8 g/dL — AB (ref 30.0–36.0)
MCV: 85.8 fL (ref 78.0–100.0)
MONOS PCT: 10 %
Monocytes Absolute: 0.9 10*3/uL (ref 0.1–1.0)
NEUTROS ABS: 3.4 10*3/uL (ref 1.7–7.7)
NEUTROS PCT: 37 %
Platelets: 376 10*3/uL (ref 150–400)
RBC: 4.38 MIL/uL (ref 3.87–5.11)
RDW: 16.8 % — ABNORMAL HIGH (ref 11.5–15.5)
WBC: 9.2 10*3/uL (ref 4.0–10.5)

## 2017-06-03 LAB — BASIC METABOLIC PANEL
Anion gap: 13 (ref 5–15)
BUN: <5 mg/dL — ABNORMAL LOW (ref 6–20)
CHLORIDE: 104 mmol/L (ref 101–111)
CO2: 21 mmol/L — ABNORMAL LOW (ref 22–32)
CREATININE: 0.61 mg/dL (ref 0.44–1.00)
Calcium: 8.7 mg/dL — ABNORMAL LOW (ref 8.9–10.3)
GFR calc non Af Amer: >60 mL/min (ref >60)
Glucose, Bld: 143 mg/dL — ABNORMAL HIGH (ref 65–99)
POTASSIUM: 4.9 mmol/L (ref 3.5–5.1)
Sodium: 138 mmol/L (ref 135–145)

## 2017-06-03 MED ORDER — CLINDAMYCIN PHOSPHATE 300 MG/50ML IV SOLN
300.0000 mg | Freq: Three times a day (TID) | INTRAVENOUS | Status: DC
  Administered 2017-06-03 – 2017-06-04 (×3): 300 mg via INTRAVENOUS
  Filled 2017-06-03 (×4): qty 50

## 2017-06-03 NOTE — Progress Notes (Signed)
Elease Etiennehristina Handrich PROGRESS NOTE:   SUBJECTIVE: Pain right jaw.  OBJECTIVE:  Vitals: Blood pressure 133/83, pulse 72, temperature 98.2 F (36.8 C), temperature source Oral, resp. rate 15, height 5\' 1"  (1.549 m), weight 240 lb (108.9 kg), last menstrual period 04/28/2017, SpO2 98 %. Lab results: Results for orders placed or performed during the hospital encounter of 05/28/17 (from the past 24 hour(s))  CBC with Differential/Platelet     Status: Abnormal   Collection Time: 06/03/17  6:52 AM  Result Value Ref Range   WBC 9.2 4.0 - 10.5 K/uL   RBC 4.38 3.87 - 5.11 MIL/uL   Hemoglobin 11.2 (L) 12.0 - 15.0 g/dL   HCT 16.137.6 09.636.0 - 04.546.0 %   MCV 85.8 78.0 - 100.0 fL   MCH 25.6 (L) 26.0 - 34.0 pg   MCHC 29.8 (L) 30.0 - 36.0 g/dL   RDW 40.916.8 (H) 81.111.5 - 91.415.5 %   Platelets 376 150 - 400 K/uL   Neutrophils Relative % 37 %   Neutro Abs 3.4 1.7 - 7.7 K/uL   Lymphocytes Relative 51 %   Lymphs Abs 4.7 (H) 0.7 - 4.0 K/uL   Monocytes Relative 10 %   Monocytes Absolute 0.9 0.1 - 1.0 K/uL   Eosinophils Relative 2 %   Eosinophils Absolute 0.2 0.0 - 0.7 K/uL   Basophils Relative 0 %   Basophils Absolute 0.0 0.0 - 0.1 K/uL  Basic metabolic panel     Status: Abnormal   Collection Time: 06/03/17  6:52 AM  Result Value Ref Range   Sodium 138 135 - 145 mmol/L   Potassium 4.9 3.5 - 5.1 mmol/L   Chloride 104 101 - 111 mmol/L   CO2 21 (L) 22 - 32 mmol/L   Glucose, Bld 143 (H) 65 - 99 mg/dL   BUN <5 (L) 6 - 20 mg/dL   Creatinine, Ser 7.820.61 0.44 - 1.00 mg/dL   Calcium 8.7 (L) 8.9 - 10.3 mg/dL   GFR calc non Af Amer >60 >60 mL/min   GFR calc Af Amer >60 >60 mL/min   Anion gap 13 5 - 15   Radiology Results: No results found. General appearance: alert, cooperative and no distress Head: Normocephalic, without obvious abnormality, atraumatic Eyes: negative Nose: Nares normal. Septum midline. Mucosa normal. No drainage or sinus tenderness. Throat: Orally no purulence, edema, fluctuance. no trismus. extraction  socket #31 normal Neck: no adenopathy, supple, symmetrical, trachea midline and Induration anterior right mandible. less erythema today. Continues to drain.  ASSESSMENT: Slowly resolving infection right submandibular/sublingual space.  PLAN: Plan to D/C drain tomorrow. Continue IV abx, pain meds.   Ocie DoyneScott Iver Miklas 06/03/2017

## 2017-06-03 NOTE — Progress Notes (Signed)
[facility] PROGRESS NOTE  Ashley Donaldson [id number] DOB: 09/13/1975 DOA: 05/28/2017 PCP: System, Pcp Not In  Assessment/Plan:  Periapical abscess: Acute.    Persisting right lower jaw swelling and pain after root canal 10 days ago despite being on antibiotics of amoxicillin and subsequently Augmentin.  Abscess was debrided --2/5 by Dr. [name]--pain meds ibuprofen every 6, Norco scheduled every 4 and Dilaudid for breakthrough pain is not controlled---she is needing IV breakthrough medication every so often and I do not feel she is ready for discharge - trasnition clindamycin IV to PO clinda 300 q but because of increasing pain with mastication feel we should keep on IV antibiotics at least until pain is controlled and she is cleared by surgery so switched back to antibiotics again on 2/7 -Negative so far culture data since 2/5 continue IV saline at 50 an hour   Leukocytosis: Acute.  WBC elevated to 21.2-->17.8 an down to 9.2 and feel that overtly most of the infection is diminished Note that I would not use any further steroids in her case as would offer nothing else beneficial from my perspective defer to dentistry if they wish to use  steroid-induced hyperglycemia: suagrs 152-218 and stable - Continue to monitor only--would not cover for now  Hypothyroidism - Continue levothyroxine  175 mcg a.m.  Hypokalemia: acute.  Initial potassium noted to be 3.2 - cannot tolerate Iv replacement-place on 40 Tid, to be repeated on 2/7, stop replacement 2/7  vulvovaginal candidiasis Probably secondary to use of steroids in addition to broad-spectrum antibiotics Giving 1 dose of Diflucan 150 on 2/6   Full code, Lovenox, inpatient, dispo pending   Consultants:  Dentistry as above  Procedures:  I&D and removal of tooth #31 2/5  Antibiotics:  Cleocin 2/2 -->P  HPI/Subjective:   6/10 pain and worsens with mastication No chest pain No blurred vision No double  vision    Objective: Vitals:   06/02/17 2003 06/03/17 0517  BP: 124/79 133/83  Pulse: 60 72  Resp: 16 15  Temp: 98.7 F (37.1 C) 98.2 F (36.8 C)  SpO2: 100% 98%    Intake/Output Summary (Last 24 hours) at 06/03/2017 1053 Last data filed at 06/02/2017 1900 Gross per 24 hour  Intake 100 ml  Output -  Net 100 ml   Filed Weights   05/28/17 1537  Weight: 108.9 kg (240 lb)    Exam:  Awake alert no pallor no ict, EOMI ncat, pleasant alert oriented s1 s2 no mrg Chest clear without rales nor rhonchi, trachea midline Bandages under right jaw which were not uncovered abd soft nt nd no rebound no guard Neuro intact moving 4 limbs equally-no deficit and power 5/5, reflexes not tested sensory wnl Skin no rash no redness    Data Reviewed: Basic Metabolic Panel: Recent Labs  Lab 05/28/17 1544 05/29/17 0903 05/31/17 0849 06/01/17 0615 06/03/17 0652  NA 140 137 136 140 138  K 3.2* 3.6 2.9* 4.0 4.9  CL 106 102 95* 103 104  CO2 22 24 25 26  21*  GLUCOSE 183* 247* 218* 152* 143*  BUN 12 7 <5* 5* <5*  CREATININE 0.69 0.62 0.49 0.54 0.61  CALCIUM 8.9 8.6* 8.2* 8.6* 8.7*  PHOS  --   --   --  3.2  --    Liver Function Tests: Recent Labs  Lab 05/28/17 1544 06/01/17 0615  AST 21  --   ALT 11*  --   ALKPHOS 80  --   BILITOT 0.5  --  PROT 6.2*  --   ALBUMIN 3.3* 2.5*   No results for input(s): LIPASE, AMYLASE in the last 168 hours. No results for input(s): AMMONIA in the last 168 hours. CBC: Recent Labs  Lab 05/28/17 1544 05/29/17 0903 05/31/17 0849 06/01/17 0615 06/03/17 0652  WBC 21.2* 17.8* 19.1* 9.0 9.2  NEUTROABS 15.3*  --  14.4* 4.6 3.4  HGB 13.2 12.0 12.2 11.8* 11.2*  HCT 42.5 38.3 39.2 38.2 37.6  MCV 83.3 84.2 83.9 84.3 85.8  PLT 451* 394 350 348 376   Cardiac Enzymes: No results for input(s): CKTOTAL, CKMB, CKMBINDEX, TROPONINI in the last 168 hours. BNP (last 3 results) No results for input(s): BNP in the last 8760 hours.  ProBNP (last 3  results) No results for input(s): PROBNP in the last 8760 hours.  CBG: No results for input(s): GLUCAP in the last 168 hours.  Recent Results (from the past 240 hour(s))  Surgical pcr screen     Status: None   Collection Time: 05/31/17 10:16 PM  Result Value Ref Range Status   MRSA, PCR NEGATIVE NEGATIVE Final   Staphylococcus aureus NEGATIVE NEGATIVE Final    Comment: (NOTE) The Xpert SA Assay (FDA approved for NASAL specimens in patients 41 years of age and older), is one component of a comprehensive surveillance program. It is not intended to diagnose infection nor to guide or monitor treatment. Performed at [facility], [address], [place], [place] [address]   Aerobic/Anaerobic Culture (surgical/deep wound)     Status: None (Preliminary result)   Collection Time: 06/01/17 11:29 AM  Result Value Ref Range Status   Specimen Description ABSCESS MOUTH  Final   Special Requests NONE  Final   Gram Stain   Final    RARE WBC PRESENT, PREDOMINANTLY PMN NO ORGANISMS SEEN    Culture   Final    NO GROWTH 1 DAY Performed at [facility], [address], [place], [place] [address]    Report Status PENDING  Incomplete     Studies: No results found.  Scheduled Meds: . enoxaparin (LOVENOX) injection  40 mg Subcutaneous Daily  . HYDROcodone-acetaminophen  1-2 tablet Oral Q4H  . ibuprofen  600 mg Oral Q6H  . levothyroxine  175 mcg Oral QAC breakfast  . potassium chloride  40 mEq Oral TID   Continuous Infusions: . clindamycin (CLEOCIN) IV    . lactated ringers Stopped (06/02/17 1900)    Principal Problem:   Ludwig's angina Active Problems:   Periapical abscess   Hypothyroidism   Steroid-induced hyperglycemia   Leukocytosis    Time spent: 15

## 2017-06-03 NOTE — Anesthesia Postprocedure Evaluation (Signed)
Anesthesia Post Note  Patient: Ashley EtienneChristina Donaldson  Procedure(s) Performed: MULTIPLE EXTRACTION WITH IRRIGATION AND DEBRIDEMENT (N/A Mouth)     Patient location during evaluation: PACU Anesthesia Type: General Level of consciousness: awake and alert Pain management: pain level controlled Vital Signs Assessment: post-procedure vital signs reviewed and stable Respiratory status: spontaneous breathing, nonlabored ventilation, respiratory function stable and patient connected to nasal cannula oxygen Cardiovascular status: blood pressure returned to baseline and stable Postop Assessment: no apparent nausea or vomiting Anesthetic complications: no    Last Vitals:  Vitals:   06/03/17 0517 06/03/17 1515  BP: 133/83 131/87  Pulse: 72 66  Resp: 15 16  Temp: 36.8 C 36.7 C  SpO2: 98% 98%    Last Pain:  Vitals:   06/03/17 1701  TempSrc:   PainSc: Asleep                 Hamzeh Tall

## 2017-06-04 MED ORDER — HYDROCODONE-ACETAMINOPHEN 5-325 MG PO TABS: 1.0000 | ORAL_TABLET | ORAL | 0 refills | Status: DC

## 2017-06-04 MED ORDER — CLINDAMYCIN HCL 300 MG PO CAPS: 300.0000 mg | ORAL_CAPSULE | Freq: Three times a day (TID) | ORAL | 0 refills | Status: AC

## 2017-06-04 NOTE — Progress Notes (Signed)
Ashley Donaldson PROGRESS NOTE:   SUBJECTIVE: Feeling better. No IV pain meds last night  OBJECTIVE:  Vitals: Blood pressure 130/78, pulse 78, temperature 98.6 F (37 C), temperature source Oral, resp. rate 16, height 5\' 1"  (1.549 m), weight 240 lb (108.9 kg), last menstrual period 04/28/2017, SpO2 98 %. Lab results:No results found for this or any previous visit (from the past 24 hour(s)). Radiology Results: No results found. General appearance: alert, cooperative and morbidly obese Head: Normocephalic, without obvious abnormality, atraumatic Eyes: negative Nose: Nares normal. Septum midline. Mucosa normal. No drainage or sinus tenderness. Throat: lips, mucosa, and tongue normal; teeth and gums normal and healing extraction socket right mandible; no purulence, fluctuance, trismus.  Neck: no adenopathy, supple, symmetrical, trachea midline and mild drainage, less tenderness, erythema  ASSESSMENT: Stable for D/C  PLAN: Drain removed bedside. Continue Antibiotics, pain meds, soft diet. Will follow Monday or Tuesday in office. Patient to call for appointment.    Ashley Donaldson 06/04/2017

## 2017-06-04 NOTE — Progress Notes (Signed)
Ashley Donaldson to be D/C'd Home per MD order.  Discussed prescriptions and follow up appointments with the patient. Prescriptions given to patient, medication list explained in detail. Pt verbalized understanding.  Allergies as of 06/04/2017      Reactions   Methylpyrrolidone Shortness Of Breath   [facility] - pt not aware of this allergy   Ciprofloxacin Rash   Omni-pac Rash   "cefdinir"      Medication List    STOP taking these medications   amoxicillin-clavulanate 500-125 MG tablet Commonly known as:  AUGMENTIN   predniSONE 5 MG tablet Commonly known as:  DELTASONE   traMADol-acetaminophen 37.5-325 MG tablet Commonly known as:  ULTRACET     TAKE these medications   acetaminophen 500 MG tablet Commonly known as:  TYLENOL Take 500 mg by mouth every 6 (six) hours as needed for headache (pain).   ALPRAZolam 0.5 MG tablet Commonly known as:  XANAX Take 0.5 mg by mouth daily as needed for anxiety.   clindamycin 300 MG capsule Commonly known as:  CLEOCIN Take 1 capsule (300 mg total) by mouth 3 (three) times daily for 7 days.   HYDROcodone-acetaminophen 5-325 MG tablet Commonly known as:  NORCO/VICODIN Take 1-2 tablets by mouth every 4 (four) hours.   ibuprofen 200 MG tablet Commonly known as:  ADVIL,MOTRIN Take 400 mg by mouth every 6 (six) hours as needed (pain).   levothyroxine 175 MCG tablet Commonly known as:  SYNTHROID, LEVOTHROID Take 175 mcg by mouth daily before breakfast.   MUCINEX MAXIMUM STRENGTH 1200 MG Tb12 Generic drug:  Guaifenesin Take 1,200 mg by mouth 2 (two) times daily as needed (sinus headache).       Vitals:   06/04/17 0638 06/04/17 0900  BP: 139/72 130/78  Pulse: 65 78  Resp: 16   Temp: 98.3 F (36.8 C) 98.6 F (37 C)  SpO2: 98% 98%    Skin clean, dry and intact without evidence of skin break down, no evidence of skin tears noted. Dressing changed before discharge per RN, clean, dry, and intact. IV catheter  discontinued intact. Site without signs and symptoms of complications. Dressing and pressure applied. Pt denies pain at this time. No complaints noted.  An After Visit Summary and prescriptions were printed and given to the patient. Patient escorted via WC, and D/C home via private auto.  GrenadaBrittany Kei Mcelhiney RN

## 2017-06-04 NOTE — Discharge Summary (Addendum)
Physician Discharge Summary  Ashley Donaldson ZOX:[id number] DOB: February 20, 1976 DOA: 05/28/2017  PCP: System, Pcp Not In  Admit date: 05/28/2017 Discharge date: 06/04/2017  Time spent: 30 minutes  Recommendations for Outpatient Follow-up:  1. Patient should complete 7 more days of p.o. clindamycin 300 3 times daily 2/15 2. Limited prescription of hydrocodone given to the patient 3. Needs to follow-up with dental Dr. Barbette Merino as an outpatient 4. Follow cultures as an outpatient  Discharge Diagnoses:  Principal Problem:   Ludwig's angina Active Problems:   Periapical abscess   Hypothyroidism   Steroid-induced hyperglycemia   Leukocytosis   Discharge Condition: Improved  Diet recommendation: Heart healthy  Filed Weights   05/28/17 1537  Weight: 108.9 kg (240 lb)    History of present illness:  42 year old female admitted with a failed root canal and 2 rounds of antibiotics came into the hospital with cellulitis swelling pain and needed surgery see below  Hospital Course:  Periapical abscess: Acute.   Persisting right lower jaw swelling and pain after root canal 10 days ago despite being on antibiotics of amoxicillin and subsequently Augmentin.  Abscess was debrided --2/5 by Dr. Genella Rife meds ibuprofen every 6, Norco scheduled every 4 and Dilaudid for breakthrough pain is more reasonably controlled - trasnition clindamycin IV to PO clinda 300 q 8 and was switched back to oral antibiotics on discharge -No organisms found in abscess  Leukocytosis:Acute. WBC elevated to 21.2-->17.8 an down to 9.2 and feel that overtly most of the infection is diminished Note that I would not use any further steroids in her case as would offer nothing else beneficial from my perspective defer to dentistry if they wish to use  steroid-induced hyperglycemia: suagrs 152-218 and stable - Continue to monitor only--would not cover for now  Hypothyroidism - Continue levothyroxine  175 mcg  a.m.  Hypokalemia:acute. Initial potassium noted to be 3.2 -cannot tolerate Iv replacement-place on 40 Tid, to be repeated on 2/7, stop replacement 2/7  vulvovaginal candidiasis Probably secondary to use of steroids in addition to broad-spectrum antibiotics Giving 1 dose of Diflucan 150 on 2/6    Procedures:  Dental abcess and tooth extration 2/5   Consultations:  Dr. Barbette Merino of dental surgery  Discharge Exam: Vitals:   06/03/17 2024 06/04/17 0638  BP: 130/77 139/72  Pulse: 65 65  Resp: 16 16  Temp: 97.7 F (36.5 C) 98.3 F (36.8 C)  SpO2: 98% 98%    Awake alert no pallor no ict, EOMI ncat, pleasant alert oriented s1 s2 no mrg Chest clear without rales nor rhonchi, trachea midline Bandages under right jaw which were not uncovered--looks less swollen today and improved abd soft nt nd no rebound no guard Neuro intact moving 4 limbs equally-no deficit and power 5/5, reflexes not tested sensory wnl Skin no rash no redness     Discharge Instructions   Discharge Instructions    Diet - low sodium heart healthy   Complete by:  As directed    Discharge instructions   Complete by:  As directed    Take the clindamycin and complete the same as an outpatient and follow-up with dental surgeon we will give you a prescription for Norco on discharge you can use ibuprofen for breakthrough pain he will need lab work in about 1-2 weeks to ensure kidneys are functioning okay   Increase activity slowly   Complete by:  As directed      Allergies as of 06/04/2017      Reactions   Methylpyrrolidone  Shortness Of Breath   Reported by [facility] 04/24/14 - pt not aware of this allergy   Ciprofloxacin Rash   Omni-pac Rash   "cefdinir"      Medication List    STOP taking these medications   amoxicillin-clavulanate 500-125 MG tablet Commonly known as:  AUGMENTIN   predniSONE 5 MG tablet Commonly known as:  DELTASONE   traMADol-acetaminophen 37.5-325 MG tablet Commonly  known as:  ULTRACET     TAKE these medications   acetaminophen 500 MG tablet Commonly known as:  TYLENOL Take 500 mg by mouth every 6 (six) hours as needed for headache (pain).   ALPRAZolam 0.5 MG tablet Commonly known as:  XANAX Take 0.5 mg by mouth daily as needed for anxiety.   clindamycin 300 MG capsule Commonly known as:  CLEOCIN Take 1 capsule (300 mg total) by mouth 3 (three) times daily for 7 days.   HYDROcodone-acetaminophen 5-325 MG tablet Commonly known as:  NORCO/VICODIN Take 1-2 tablets by mouth every 4 (four) hours.   ibuprofen 200 MG tablet Commonly known as:  ADVIL,MOTRIN Take 400 mg by mouth every 6 (six) hours as needed (pain).   levothyroxine 175 MCG tablet Commonly known as:  SYNTHROID, LEVOTHROID Take 175 mcg by mouth daily before breakfast.   MUCINEX MAXIMUM STRENGTH 1200 MG Tb12 Generic drug:  Guaifenesin Take 1,200 mg by mouth 2 (two) times daily as needed (sinus headache).      Allergies  Allergen Reactions  . Methylpyrrolidone Shortness Of Breath    Reported by [facility] 04/24/14 - pt not aware of this allergy  . Ciprofloxacin Rash  . Omni-Pac Rash    "cefdinir"      The results of significant diagnostics from this hospitalization (including imaging, microbiology, ancillary and laboratory) are listed below for reference.    Significant Diagnostic Studies: Ct Soft Tissue Neck W Contrast  Result Date: 05/28/2017 CLINICAL DATA:  RIGHT facial swelling after root canal 10 days ago. On antibiotics without improvement. History of Graves disease. EXAM: CT NECK WITH CONTRAST TECHNIQUE: Multidetector CT imaging of the neck was performed using the standard protocol following the bolus administration of intravenous contrast. CONTRAST:  75mL ISOVUE-300 IOPAMIDOL (ISOVUE-300) INJECTION 61% COMPARISON:  None. FINDINGS: PHARYNX AND LARYNX: Mild RIGHT peritonsillar inflammation. Normal larynx. Normal epiglottis. SALIVARY GLANDS: Normal. THYROID: Normal.  LYMPH NODES: Prominent though not pathologically enlarged RIGHT level 1 B lymph nodes are likely reactive. VASCULAR: Normal. LIMITED INTRACRANIAL: Normal. VISUALIZED ORBITS: Normal. MASTOIDS AND VISUALIZED PARANASAL SINUSES: Well-aerated. SKELETON: Scattered dental caries. Tooth 9 and approximate tooth 31 periapical abscess with osseous dehiscence and cortical disruption. UPPER CHEST: Lung apices are clear. No superior mediastinal lymphadenopathy. OTHER: 2.9 x 3.3 x 3.2 cm rim enhancing fluid collection contiguous with body of the RIGHT mandible. RIGHT lower face free fluid extending into the RIGHT submandibular space, RIGHT parotid space with platysma thickening. RIGHT floor of mouth effusion. No subcutaneous gas or radiopaque foreign bodies. Inflammatory changes RIGHT retromolar trigone, RIGHT parapharyngeal fat planes inferiorly. IMPRESSION: 1. Approximate tooth 31 periapical abscess with osseous dehiscence. 2.9 x 3.3 x 3.2 cm associated RIGHT body of the mandible subperiosteal abscess. RIGHT lower face cellulitis, transspatial edema and effusion. Electronically Signed   By: Awilda Metro M.D.   On: 05/28/2017 23:23    Microbiology: Recent Results (from the past 240 hour(s))  Surgical pcr screen     Status: None   Collection Time: 05/31/17 10:16 PM  Result Value Ref Range Status   MRSA, PCR NEGATIVE NEGATIVE  Final   Staphylococcus aureus NEGATIVE NEGATIVE Final    Comment: (NOTE) The Xpert SA Assay (FDA approved for NASAL specimens in patients 41 years of age and older), is one component of a comprehensive surveillance program. It is not intended to diagnose infection nor to guide or monitor treatment. Performed at [facility], [address] [place], [place] [address]   Aerobic/Anaerobic Culture (surgical/deep wound)     Status: None (Preliminary result)   Collection Time: 06/01/17 11:29 AM  Result Value Ref Range Status   Specimen Description ABSCESS MOUTH  Final   Special  Requests NONE  Final   Gram Stain   Final    RARE WBC PRESENT, PREDOMINANTLY PMN NO ORGANISMS SEEN    Culture   Final    NO GROWTH 2 DAYS NO ANAEROBES ISOLATED; CULTURE IN PROGRESS FOR 5 DAYS Performed at [facility], [address] [place], [place] [address]    Report Status PENDING  Incomplete     Labs: Basic Metabolic Panel: Recent Labs  Lab 05/28/17 1544 05/29/17 0903 05/31/17 0849 06/01/17 0615 06/03/17 0652  NA 140 137 136 140 138  K 3.2* 3.6 2.9* 4.0 4.9  CL 106 102 95* 103 104  CO2 22 24 25 26  21*  GLUCOSE 183* 247* 218* 152* 143*  BUN 12 7 <5* 5* <5*  CREATININE 0.69 0.62 0.49 0.54 0.61  CALCIUM 8.9 8.6* 8.2* 8.6* 8.7*  PHOS  --   --   --  3.2  --    Liver Function Tests: Recent Labs  Lab 05/28/17 1544 06/01/17 0615  AST 21  --   ALT 11*  --   ALKPHOS 80  --   BILITOT 0.5  --   PROT 6.2*  --   ALBUMIN 3.3* 2.5*   No results for input(s): LIPASE, AMYLASE in the last 168 hours. No results for input(s): AMMONIA in the last 168 hours. CBC: Recent Labs  Lab 05/28/17 1544 05/29/17 0903 05/31/17 0849 06/01/17 0615 06/03/17 0652  WBC 21.2* 17.8* 19.1* 9.0 9.2  NEUTROABS 15.3*  --  14.4* 4.6 3.4  HGB 13.2 12.0 12.2 11.8* 11.2*  HCT 42.5 38.3 39.2 38.2 37.6  MCV 83.3 84.2 83.9 84.3 85.8  PLT 451* 394 350 348 376   Cardiac Enzymes: No results for input(s): CKTOTAL, CKMB, CKMBINDEX, TROPONINI in the last 168 hours. BNP: BNP (last 3 results) No results for input(s): BNP in the last 8760 hours.  ProBNP (last 3 results) No results for input(s): PROBNP in the last 8760 hours.  CBG: No results for input(s): GLUCAP in the last 168 hours.     Signed:  Rhetta MuraJai-Gurmukh Issabela Lesko MD   [facility] 06/04/2017, 9:21 AM

## 2017-06-07 LAB — AEROBIC/ANAEROBIC CULTURE (SURGICAL/DEEP WOUND)

## 2017-06-07 LAB — AEROBIC/ANAEROBIC CULTURE W GRAM STAIN (SURGICAL/DEEP WOUND)

## 2017-06-25 ENCOUNTER — Inpatient Hospital Stay (HOSPITAL_COMMUNITY)
Admission: EM | Admit: 2017-06-25 | Discharge: 2017-06-27 | DRG: 603 | Disposition: A | Discharge status: Home / Self Care | Payer: BLUE CROSS/BLUE SHIELD | Attending: Internal Medicine | Admitting: Internal Medicine

## 2017-06-25 ENCOUNTER — Inpatient Hospital Stay (HOSPITAL_COMMUNITY): Payer: BLUE CROSS/BLUE SHIELD

## 2017-06-25 ENCOUNTER — Other Ambulatory Visit: Payer: Self-pay

## 2017-06-25 ENCOUNTER — Emergency Department (HOSPITAL_COMMUNITY): Payer: BLUE CROSS/BLUE SHIELD

## 2017-06-25 ENCOUNTER — Encounter (HOSPITAL_COMMUNITY): Payer: Self-pay

## 2017-06-25 DIAGNOSIS — E039 Hypothyroidism, unspecified: Secondary | ICD-10-CM | POA: Diagnosis present

## 2017-06-25 DIAGNOSIS — L03211 Cellulitis of face: Principal | ICD-10-CM

## 2017-06-25 DIAGNOSIS — Z6841 Body Mass Index (BMI) 40.0 and over, adult: Secondary | ICD-10-CM | POA: Diagnosis not present

## 2017-06-25 DIAGNOSIS — T380X5A Adverse effect of glucocorticoids and synthetic analogues, initial encounter: Secondary | ICD-10-CM

## 2017-06-25 DIAGNOSIS — E1165 Type 2 diabetes mellitus with hyperglycemia: Secondary | ICD-10-CM | POA: Diagnosis present

## 2017-06-25 DIAGNOSIS — E119 Type 2 diabetes mellitus without complications: Secondary | ICD-10-CM | POA: Diagnosis not present

## 2017-06-25 DIAGNOSIS — Z79899 Other long term (current) drug therapy: Secondary | ICD-10-CM

## 2017-06-25 DIAGNOSIS — F419 Anxiety disorder, unspecified: Secondary | ICD-10-CM | POA: Diagnosis present

## 2017-06-25 DIAGNOSIS — Z79890 Hormone replacement therapy (postmenopausal): Secondary | ICD-10-CM

## 2017-06-25 DIAGNOSIS — L0390 Cellulitis, unspecified: Secondary | ICD-10-CM | POA: Diagnosis present

## 2017-06-25 DIAGNOSIS — F17210 Nicotine dependence, cigarettes, uncomplicated: Secondary | ICD-10-CM | POA: Diagnosis present

## 2017-06-25 DIAGNOSIS — Z888 Allergy status to other drugs, medicaments and biological substances status: Secondary | ICD-10-CM | POA: Diagnosis not present

## 2017-06-25 DIAGNOSIS — R739 Hyperglycemia, unspecified: Secondary | ICD-10-CM

## 2017-06-25 LAB — COMPREHENSIVE METABOLIC PANEL
ALK PHOS: 77 U/L (ref 38–126)
ALT: 19 U/L (ref 14–54)
AST: 31 U/L (ref 15–41)
Albumin: 3.4 g/dL — ABNORMAL LOW (ref 3.5–5.0)
Anion gap: 13 (ref 5–15)
BUN: 6 mg/dL (ref 6–20)
CO2: 21 mmol/L — AB (ref 22–32)
CREATININE: 0.58 mg/dL (ref 0.44–1.00)
Calcium: 9 mg/dL (ref 8.9–10.3)
Chloride: 104 mmol/L (ref 101–111)
GFR calc Af Amer: >60 mL/min (ref >60)
Glucose, Bld: 145 mg/dL — ABNORMAL HIGH (ref 65–99)
Potassium: 3.6 mmol/L (ref 3.5–5.1)
Sodium: 138 mmol/L (ref 135–145)
Total Bilirubin: 0.6 mg/dL (ref 0.3–1.2)
Total Protein: 6.3 g/dL — ABNORMAL LOW (ref 6.5–8.1)

## 2017-06-25 LAB — URINALYSIS, ROUTINE W REFLEX MICROSCOPIC
Bilirubin Urine: NEGATIVE
GLUCOSE, UA: NEGATIVE mg/dL
Ketones, ur: NEGATIVE mg/dL
Leukocytes, UA: NEGATIVE
Nitrite: NEGATIVE
PH: 6 (ref 5.0–8.0)
Protein, ur: NEGATIVE mg/dL
Specific Gravity, Urine: 1.008 (ref 1.005–1.030)

## 2017-06-25 LAB — CBC WITH DIFFERENTIAL/PLATELET
Basophils Absolute: 0.1 10*3/uL (ref 0.0–0.1)
Basophils Relative: 0 %
EOS ABS: 0.1 10*3/uL (ref 0.0–0.7)
EOS PCT: 1 %
HCT: 39.9 % (ref 36.0–46.0)
Hemoglobin: 12.4 g/dL (ref 12.0–15.0)
LYMPHS ABS: 3.6 10*3/uL (ref 0.7–4.0)
Lymphocytes Relative: 31 %
MCH: 26.4 pg (ref 26.0–34.0)
MCHC: 31.1 g/dL (ref 30.0–36.0)
MCV: 85.1 fL (ref 78.0–100.0)
MONOS PCT: 6 %
Monocytes Absolute: 0.7 10*3/uL (ref 0.1–1.0)
Neutro Abs: 7.1 10*3/uL (ref 1.7–7.7)
Neutrophils Relative %: 62 %
PLATELETS: 374 10*3/uL (ref 150–400)
RBC: 4.69 MIL/uL (ref 3.87–5.11)
RDW: 16.9 % — ABNORMAL HIGH (ref 11.5–15.5)
WBC: 11.6 10*3/uL — AB (ref 4.0–10.5)

## 2017-06-25 LAB — I-STAT CG4 LACTIC ACID, ED
LACTIC ACID, VENOUS: 2.64 mmol/L — AB (ref 0.5–1.9)
Lactic Acid, Venous: 3.04 mmol/L (ref 0.5–1.9)
Lactic Acid, Venous: 1.85 mmol/L (ref 0.5–1.9)

## 2017-06-25 LAB — I-STAT BETA HCG BLOOD, ED (MC, WL, AP ONLY): I-stat hCG, quantitative: <5 m[IU]/mL (ref <5)

## 2017-06-25 MED ORDER — ALPRAZOLAM 0.5 MG PO TABS
0.5000 mg | ORAL_TABLET | Freq: Every day | ORAL | Status: DC | PRN
  Administered 2017-06-25 – 2017-06-26 (×2): 0.5 mg via ORAL
  Filled 2017-06-25 (×2): qty 1

## 2017-06-25 MED ORDER — CHLORHEXIDINE GLUCONATE 0.12 % MT SOLN
15.0000 mL | Freq: Two times a day (BID) | OROMUCOSAL | Status: DC
  Administered 2017-06-25 – 2017-06-27 (×4): 15 mL via OROMUCOSAL
  Filled 2017-06-25 (×4): qty 15

## 2017-06-25 MED ORDER — SODIUM CHLORIDE 0.9 % IV BOLUS (SEPSIS)
500.0000 mL | Freq: Once | INTRAVENOUS | Status: AC
  Administered 2017-06-25: 500 mL via INTRAVENOUS

## 2017-06-25 MED ORDER — PIPERACILLIN-TAZOBACTAM 3.375 G IVPB
3.3750 g | Freq: Three times a day (TID) | INTRAVENOUS | Status: DC
  Administered 2017-06-25 – 2017-06-27 (×5): 3.375 g via INTRAVENOUS
  Filled 2017-06-25 (×7): qty 50

## 2017-06-25 MED ORDER — ENOXAPARIN SODIUM 40 MG/0.4ML ~~LOC~~ SOLN
40.0000 mg | SUBCUTANEOUS | Status: DC
  Administered 2017-06-25 – 2017-06-26 (×2): 40 mg via SUBCUTANEOUS
  Filled 2017-06-25 (×2): qty 0.4

## 2017-06-25 MED ORDER — VANCOMYCIN HCL IN DEXTROSE 1-5 GM/200ML-% IV SOLN
1000.0000 mg | Freq: Three times a day (TID) | INTRAVENOUS | Status: DC
  Administered 2017-06-25 – 2017-06-27 (×5): 1000 mg via INTRAVENOUS
  Filled 2017-06-25 (×7): qty 200

## 2017-06-25 MED ORDER — LEVOTHYROXINE SODIUM 75 MCG PO TABS
175.0000 ug | ORAL_TABLET | Freq: Every day | ORAL | Status: DC
  Administered 2017-06-26 – 2017-06-27 (×2): 175 ug via ORAL
  Filled 2017-06-25 (×2): qty 1

## 2017-06-25 MED ORDER — PIPERACILLIN-TAZOBACTAM 3.375 G IVPB 30 MIN
3.3750 g | Freq: Once | INTRAVENOUS | Status: AC
  Administered 2017-06-25: 3.375 g via INTRAVENOUS
  Filled 2017-06-25: qty 50

## 2017-06-25 MED ORDER — SODIUM CHLORIDE 0.9 % IV BOLUS (SEPSIS)
1000.0000 mL | Freq: Once | INTRAVENOUS | Status: AC
  Administered 2017-06-25: 1000 mL via INTRAVENOUS

## 2017-06-25 MED ORDER — SODIUM CHLORIDE 0.9 % IV SOLN
INTRAVENOUS | Status: AC
  Administered 2017-06-25: 22:00:00 via INTRAVENOUS

## 2017-06-25 MED ORDER — VANCOMYCIN HCL 10 G IV SOLR
2000.0000 mg | Freq: Once | INTRAVENOUS | Status: AC
  Administered 2017-06-25: 2000 mg via INTRAVENOUS
  Filled 2017-06-25: qty 2000

## 2017-06-25 MED ORDER — IOPAMIDOL (ISOVUE-300) INJECTION 61%
INTRAVENOUS | Status: AC
  Administered 2017-06-25: 75 mL
  Filled 2017-06-25: qty 75

## 2017-06-25 MED ORDER — GUAIFENESIN ER 600 MG PO TB12: 1200.0000 mg | ORAL_TABLET | Freq: Two times a day (BID) | ORAL | Status: DC | PRN

## 2017-06-25 MED ORDER — ACETAMINOPHEN 500 MG PO TABS
1000.0000 mg | ORAL_TABLET | Freq: Once | ORAL | Status: AC
  Administered 2017-06-25: 1000 mg via ORAL
  Filled 2017-06-25: qty 2

## 2017-06-25 MED ORDER — KETOROLAC TROMETHAMINE 15 MG/ML IJ SOLN
15.0000 mg | Freq: Once | INTRAMUSCULAR | Status: AC
  Administered 2017-06-25: 15 mg via INTRAVENOUS
  Filled 2017-06-25: qty 1

## 2017-06-25 MED ORDER — IBUPROFEN 400 MG PO TABS
400.0000 mg | ORAL_TABLET | Freq: Four times a day (QID) | ORAL | Status: DC | PRN
  Administered 2017-06-25 – 2017-06-26 (×2): 400 mg via ORAL
  Filled 2017-06-25 (×2): qty 1

## 2017-06-25 NOTE — ED Provider Notes (Signed)
[facility] EMERGENCY DEPARTMENT Provider Note   CSN: [id number] Arrival date & time: 06/25/17  1028     History   Chief Complaint No chief complaint on file.   HPI Ashley Donaldson is a 42 y.o. female who presents the emergency department chief complaint of right facial pain and swelling.  The patient is status post incision and drainage of right periapical abscess resulting in Ludwig's angina for which she was admitted in February.  Had her abscess debrided on 06/01/2017 by Dr. Barbette Merino.  She was discharged and completed a course of clindamycin finishing on 219.  Patient states that she was doing well up until Monday the 25th when she started having pain and swelling in the same area of her periapical abscess.  She denies any pain with swallowing but states that she has been on clindamycin for the past 5 days and her pain and swelling seem to be worsening.  She had an appointment for an ER follow-up with her PCP today who sent her to the emergency department for reevaluation.  She denies fevers or chills.  He does complain of pain in her lower jaw or radiating into the right ear.  HPI  Past Medical History:  Diagnosis Date  . Anxiety   . Hypothyroidism   . Obesity   . Periapical abscess with facial involvement 05/31/2027    Patient Active Problem List   Diagnosis Date Noted  . Periapical abscess 05/29/2017  . Hypothyroidism 05/29/2017  . Steroid-induced hyperglycemia 05/29/2017  . Leukocytosis 05/29/2017  . Ludwig's angina 05/29/2017    Past Surgical History:  Procedure Laterality Date  . COLON SURGERY  1994  . MULTIPLE EXTRACTIONS WITH ALVEOLOPLASTY N/A 06/01/2017   Procedure: MULTIPLE EXTRACTION WITH IRRIGATION AND DEBRIDEMENT;  Surgeon: Ocie Doyne, DDS;  Location: [facility] OR;  Service: Oral Surgery;  Laterality: N/A;  . ROOT CANAL      OB History    No data available       Home Medications    Prior to Admission medications   Medication Sig Start Date  End Date Taking? Authorizing Provider  acetaminophen (TYLENOL) 500 MG tablet Take 500 mg by mouth every 6 (six) hours as needed for headache (pain).   Yes [provider]  ALPRAZolam Prudy Feeler) 0.5 MG tablet Take 0.5 mg by mouth daily as needed for anxiety.   Yes [provider]  chlorhexidine (PERIDEX) 0.12 % solution Use as directed 15 mLs in the mouth or throat 2 (two) times daily.  06/22/17  Yes [provider]  clindamycin (CLEOCIN) 300 MG capsule Take 300 mg by mouth 3 (three) times daily. 06/22/17  Yes [provider]  Guaifenesin (MUCINEX MAXIMUM STRENGTH) 1200 MG TB12 Take 1,200 mg by mouth 2 (two) times daily as needed (sinus headache).   Yes [provider]  ibuprofen (ADVIL,MOTRIN) 200 MG tablet Take 400 mg by mouth every 6 (six) hours as needed (pain).   Yes [provider]  levothyroxine (SYNTHROID, LEVOTHROID) 175 MCG tablet Take 175 mcg by mouth daily before breakfast.   Yes [provider]  HYDROcodone-acetaminophen (NORCO/VICODIN) 5-325 MG tablet Take 1-2 tablets by mouth every 4 (four) hours. Patient not taking: Reported on 06/25/2017 06/04/17   Rhetta Mura, MD    Family History No family history on file.  Social History Social History   Tobacco Use  . Smoking status: Current Every Day Smoker    Packs/day: 0.50    Years: 5.00    Pack years: 2.50  Types: Cigarettes  . Smokeless tobacco: Never Used  Substance Use Topics  . Alcohol use: No    Frequency: Never  . Drug use: No     Allergies   Methylpyrrolidone; Ciprofloxacin; and Omni-pac   Review of Systems Review of Systems  Ten systems reviewed and are negative for acute change, except as noted in the HPI.   Physical Exam Updated Vital Signs BP 123/82   Pulse 91   Temp 99.4 F (37.4 C) (Oral)   Resp 12   Wt 106.6 kg (235 lb)   SpO2 100%   BMI 44.40 kg/m   Physical Exam  Constitutional: She is oriented to person, place, and time. She  appears well-developed and well-nourished. No distress.  HENT:  Head: Normocephalic and atraumatic.  Eyes: Conjunctivae are normal. No scleral icterus.  Neck: Normal range of motion.  Cardiovascular: Normal rate, regular rhythm and normal heart sounds. Exam reveals no gallop and no friction rub.  No murmur heard. Pulmonary/Chest: Effort normal and breath sounds normal. No respiratory distress.  Abdominal: Soft. Bowel sounds are normal. She exhibits no distension and no mass. There is no tenderness. There is no guarding.  Neurological: She is alert and oriented to person, place, and time.  Skin: Skin is warm and dry. She is not diaphoretic.  Psychiatric: Her behavior is normal.  Nursing note and vitals reviewed.    ED Treatments / Results  Labs (all labs ordered are listed, but only abnormal results are displayed) Labs Reviewed  COMPREHENSIVE METABOLIC PANEL - Abnormal; Notable for the following components:      Result Value   CO2 21 (*)    Glucose, Bld 145 (*)    Total Protein 6.3 (*)    Albumin 3.4 (*)    All other components within normal limits  CBC WITH DIFFERENTIAL/PLATELET - Abnormal; Notable for the following components:   WBC 11.6 (*)    RDW 16.9 (*)    All other components within normal limits  URINALYSIS, ROUTINE W REFLEX MICROSCOPIC - Abnormal; Notable for the following components:   Hgb urine dipstick SMALL (*)    Bacteria, UA FEW (*)    Squamous Epithelial / LPF 0-5 (*)    All other components within normal limits  I-STAT CG4 LACTIC ACID, ED - Abnormal; Notable for the following components:   Lactic Acid, Venous 3.04 (*)    All other components within normal limits  I-STAT CG4 LACTIC ACID, ED - Abnormal; Notable for the following components:   Lactic Acid, Venous 2.64 (*)    All other components within normal limits  CULTURE, BLOOD (ROUTINE X 2)  CULTURE, BLOOD (ROUTINE X 2)  I-STAT BETA HCG BLOOD, ED ([facility], [facility], [facility] ONLY)  I-STAT CG4 LACTIC ACID, ED  I-STAT CG4  LACTIC ACID, ED    EKG  EKG Interpretation None       Radiology No results found.  Procedures Procedures (including critical care time)  Medications Ordered in ED Medications  sodium chloride 0.9 % bolus 1,000 mL (0 mLs Intravenous Stopped 06/25/17 1445)    And  sodium chloride 0.9 % bolus 1,000 mL (0 mLs Intravenous Stopped 06/25/17 1356)    And  sodium chloride 0.9 % bolus 1,000 mL (1,000 mLs Intravenous New Bag/Given 06/25/17 1446)    And  sodium chloride 0.9 % bolus 500 mL (500 mLs Intravenous New Bag/Given 06/25/17 1445)  vancomycin (VANCOCIN) 2,000 mg in sodium chloride 0.9 % 500 mL IVPB (2,000 mg Intravenous New Bag/Given 06/25/17 1356)  vancomycin (VANCOCIN) IVPB 1000 mg/200 mL premix (not administered)  piperacillin-tazobactam (ZOSYN) IVPB 3.375 g (not administered)  acetaminophen (TYLENOL) tablet 1,000 mg (not administered)  piperacillin-tazobactam (ZOSYN) IVPB 3.375 g (0 g Intravenous Stopped 06/25/17 1417)     Initial Impression / Assessment and Plan / ED Course  I have reviewed the triage vital signs and the nursing notes.  Pertinent labs & imaging results that were available during my care of the patient were reviewed by me and considered in my medical decision making (see chart for details).  Clinical Course as of Jun 25 1729  Fri Jun 25, 2017  1601 WBC: (!) 11.6 [AH]  1601 Lactic Acid, Venous: (!!) 2.64 [AH]  1601 Pulse Rate: (!) 112 [AH]    Clinical Course User Index [AH] Arthor CaptainHarris, Lavaeh Bau, PA-C    Patient with concern for recurrent infection of  Apical abscess.  Patient initial lab values place her in the sepsis protocol pathway given source, tachycardia, elevated white blood cell count and elevated lactic acid.  She is receiving antibiotics.  Her CT scan of the soft tissue is pending.  I have given sign out to Dr. Jaynie CollinsWun Augustin who will assume care. Plan consult to Dr. Barbette MerinoJensen. Final Clinical Impressions(s) / ED Diagnoses   Final diagnoses:  None    ED  Discharge Orders    None       Arthor CaptainHarris, Yerania Chamorro, PA-C 06/25/17 1732    Abelino DerrickMackuen, Courteney Lyn, MD 06/25/17 1942

## 2017-06-25 NOTE — ED Triage Notes (Signed)
Patient complains of recurrent gum and throat swelling following root canal back on 1/22. Was admitted for IV antibiotics in February and has recently finished her po antibiotics. Has again developed gum swelling with fever and pain. directed back to ED for CT and possible admission

## 2017-06-25 NOTE — H&P (Addendum)
TRH H&P   Patient Demographics:    Ashley Donaldson, is a 42 y.o. female  MRN: [id number]   DOB - 1975-11-04  Admit Date - 06/25/2017  Outpatient Primary MD for the patient is [facility]   Dr. Hardin Negus  Referring MD/NP/PA:  Marilu Favre  Outpatient Specialists:   W[place]on Reining Dr. Hoyt Koch  Patient coming from: home  No chief complaint on file.     HPI:    Ashley Donaldson  is a 42 y.o. female, w hypothyroidism , anxiety who had tooth extraction 06/01/2017 and was tx with clindamycin. C/o subjective fever, and facial swelling and pain in the jaw bone area.  Pt was told by PCP to go to ED.   In ED,  CT scan IMPRESSION: 1. Mild residual RIGHT lower face transspatial edema/cellulitis. No residual or recurrent abscess. Status post interval extraction approximate tooth 31. 2. Patent airway.   Na 138, K 3.6, Bun 6, Creatinine 0.58 Ast 31, Alt 19 Glucose 145 Wbc 11.6, Hgb 12.4, Plt 374 LA 1.85 Urinalysis negative  Pt will be admitted for facial cellulitis     Review of systems:    In addition to the HPI above,  No Fever-chills, No Headache, No changes with Vision or hearing, No problems swallowing food or Liquids, No Chest pain, Cough or Shortness of Breath, No Abdominal pain, No Nausea or Vommitting, Bowel movements are regular, No Blood in stool or Urine, No dysuria, No new skin rashes or bruises, No new joints pains-aches,  No new weakness, tingling, numbness in any extremity, No recent weight gain or loss, No polyuria, polydypsia or polyphagia, No significant Mental Stressors.  A full 10 point Review of Systems was done, except as stated above, all other Review of Systems were negative.   With Past History of the following :    Past Medical History:  Diagnosis Date  . Anxiety   . Hypothyroidism   . Obesity   .  Periapical abscess with facial involvement 05/31/2027      Past Surgical History:  Procedure Laterality Date  . COLON SURGERY  1994  . MULTIPLE EXTRACTIONS WITH ALVEOLOPLASTY N/A 06/01/2017   Procedure: MULTIPLE EXTRACTION WITH IRRIGATION AND DEBRIDEMENT;  Surgeon: Diona Browner, DDS;  Location: [place];  Service: Oral Surgery;  Laterality: N/A;  . ROOT CANAL        Social History:     Social History   Tobacco Use  . Smoking status: Current Every Day Smoker    Packs/day: 0.50    Years: 5.00    Pack years: 2.50    Types: Cigarettes  . Smokeless tobacco: Never Used  Substance Use Topics  . Alcohol use: No    Frequency: Never     Lives - at home  Mobility - walks by self   Family History :     Family History  Problem Relation  Age of Onset  . Thyroid disease Mother   . Heart attack Father       Home Medications:   Prior to Admission medications   Medication Sig Start Date End Date Taking? Authorizing Provider  acetaminophen (TYLENOL) 500 MG tablet Take 500 mg by mouth every 6 (six) hours as needed for headache (pain).   Yes [provider]  ALPRAZolam Duanne Moron) 0.5 MG tablet Take 0.5 mg by mouth daily as needed for anxiety.   Yes [provider]  chlorhexidine (PERIDEX) 0.12 % solution Use as directed 15 mLs in the mouth or throat 2 (two) times daily.  06/22/17  Yes [provider]  clindamycin (CLEOCIN) 300 MG capsule Take 300 mg by mouth 3 (three) times daily. 06/22/17  Yes [provider]  Guaifenesin (MUCINEX MAXIMUM STRENGTH) 1200 MG TB12 Take 1,200 mg by mouth 2 (two) times daily as needed (sinus headache).   Yes [provider]  ibuprofen (ADVIL,MOTRIN) 200 MG tablet Take 400 mg by mouth every 6 (six) hours as needed (pain).   Yes [provider]  levothyroxine (SYNTHROID, LEVOTHROID) 175 MCG tablet Take 175 mcg by mouth daily before breakfast.   Yes [provider]  HYDROcodone-acetaminophen  (NORCO/VICODIN) 5-325 MG tablet Take 1-2 tablets by mouth every 4 (four) hours. Patient not taking: Reported on 06/25/2017 06/04/17   Nita Sells, MD     Allergies:     Allergies  Allergen Reactions  . Methylpyrrolidone Shortness Of Breath    Reported by [facility] 04/24/14 - pt not aware of this allergy  . Ciprofloxacin Rash  . Omni-Pac Rash    "cefdinir" Can take PCN per patient.  TDD. 06/2017.     Physical Exam:   Vitals  Blood pressure 105/74, pulse 87, temperature 98.2 F (36.8 C), temperature source Oral, resp. rate 18, height 5' 1"  (1.549 m), weight 106.6 kg (235 lb), last menstrual period 05/30/2017, SpO2 98 %.   1. General lying in bed in NAD,    2. Normal affect and insight, Not Suicidal or Homicidal, Awake Alert, Oriented X 3.  3. No F.N deficits, ALL C.Nerves Intact, Strength 5/5 all 4 extremities, Sensation intact all 4 extremities, Plantars down going.  4. Ears and Eyes appear Normal, Conjunctivae clear, PERRLA. Moist Oral Mucosa.  Slight swelling right cheek.    5. Supple Neck, No stridor,  No JVD, No cervical lymphadenopathy appriciated, No Carotid Bruits.  6. Symmetrical Chest wall movement, Good air movement bilaterally, CTAB.  7. RRR, No Gallops, Rubs or Murmurs, No Parasternal Heave.  8. Positive Bowel Sounds, Abdomen Soft, No tenderness, No organomegaly appriciated,No rebound -guarding or rigidity.  9.  No Cyanosis, Normal Skin Turgor, No Skin Rash or Bruise.  10. Good muscle tone,  joints appear normal , no effusions, Normal ROM.  11. No Palpable Lymph Nodes in Neck or Axillae    Data Review:    CBC Recent Labs  Lab 06/25/17 1044  WBC 11.6*  HGB 12.4  HCT 39.9  PLT 374  MCV 85.1  MCH 26.4  MCHC 31.1  RDW 16.9*  LYMPHSABS 3.6  MONOABS 0.7  EOSABS 0.1  BASOSABS 0.1   ------------------------------------------------------------------------------------------------------------------  Chemistries  Recent Labs  Lab  06/25/17 1044  NA 138  K 3.6  CL 104  CO2 21*  GLUCOSE 145*  BUN 6  CREATININE 0.58  CALCIUM 9.0  AST 31  ALT 19  ALKPHOS 77  BILITOT 0.6   ------------------------------------------------------------------------------------------------------------------ estimated creatinine clearance is 105.2 mL/min (by  C-G formula based on SCr of 0.58 mg/dL). ------------------------------------------------------------------------------------------------------------------ No results for input(s): TSH, T4TOTAL, T3FREE, THYROIDAB in the last 72 hours.  Invalid input(s): FREET3  Coagulation profile No results for input(s): INR, PROTIME in the last 168 hours. ------------------------------------------------------------------------------------------------------------------- No results for input(s): DDIMER in the last 72 hours. -------------------------------------------------------------------------------------------------------------------  Cardiac Enzymes No results for input(s): CKMB, TROPONINI, MYOGLOBIN in the last 168 hours.  Invalid input(s): CK ------------------------------------------------------------------------------------------------------------------ No results found for: BNP   ---------------------------------------------------------------------------------------------------------------  Urinalysis    Component Value Date/Time   COLORURINE YELLOW 06/25/2017 [place] 06/25/2017 1044   LABSPEC 1.008 06/25/2017 1044   PHURINE 6.0 06/25/2017 1044   GLUCOSEU NEGATIVE 06/25/2017 1044   HGBUR SMALL (A) 06/25/2017 1044   BILIRUBINUR NEGATIVE 06/25/2017 1044   KETONESUR NEGATIVE 06/25/2017 1044   PROTEINUR NEGATIVE 06/25/2017 1044   NITRITE NEGATIVE 06/25/2017 1044   LEUKOCYTESUR NEGATIVE 06/25/2017 1044    ----------------------------------------------------------------------------------------------------------------   Imaging Results:    Dg  Orthopantogram  Result Date: 06/25/2017 CLINICAL DATA:  Right-sided jaw pain and swelling. EXAM: ORTHOPANTOGRAM/PANORAMIC COMPARISON:  None. FINDINGS: No fracture or other bony abnormality is noted. No lytic lesion is noted. IMPRESSION: No abnormality seen involving the mandible. Electronically Signed   By: Marijo Conception, M.D.   On: 06/25/2017 21:16   Ct Soft Tissue Neck W Contrast  Result Date: 06/25/2017 CLINICAL DATA:  Persistent swelling and low-grade fevers after root canal in January. Evaluate RIGHT neck and jaw pain. History of Graves disease. EXAM: CT NECK WITH CONTRAST TECHNIQUE: Multidetector CT imaging of the neck was performed using the standard protocol following the bolus administration of intravenous contrast. CONTRAST:  34m ISOVUE-300 IOPAMIDOL (ISOVUE-300) INJECTION 61% COMPARISON:  CT neck May 28, 2017 FINDINGS: PHARYNX AND LARYNX: Normal.  Widely patent airway. SALIVARY GLANDS: Mild RIGHT submandibular gland inflammation without sialolith, ductal dilatation or mass. THYROID: Normal. LYMPH NODES: No lymphadenopathy by CT size criteria. VASCULAR: Normal. LIMITED INTRACRANIAL: Normal. VISUALIZED ORBITS: Normal. MASTOIDS AND VISUALIZED PARANASAL SINUSES: Well-aerated. SKELETON: Scattered dental caries. Interval extraction of approximate tooth 31. Again seen is tooth 9 periapical abscess, trace potential tooth 10 periapical abscess. UPPER CHEST: Lung apices are clear. No superior mediastinal lymphadenopathy. OTHER: Thickened platysma and, mildly edematous RIGHT mild asymmetrically enlarged RIGHT belly of the digastric, RIGHT mylohyoid and RIGHT genioglossus muscles. No residual or recurrent RIGHT lower face all abscess. Mild residual RIGHT floor of mouth and RIGHT lower face soft tissue swelling. IMPRESSION: 1. Mild residual RIGHT lower face transspatial edema/cellulitis. No residual or recurrent abscess. Status post interval extraction approximate tooth 31. 2. Patent airway. Electronically  Signed   By: CElon AlasM.D.   On: 06/25/2017 16:51      Assessment & Plan:    Active Problems:   Hypothyroidism   Cellulitis    R facial celluiltis,  Blood culture ESR vanco iv , zosyn iv pharmacy to dose  Await Dr. JSalome Arnt(oral surgery), input  Hypothyroidism Cont levothyroxine  Hyperglycemia Check hga1c in am,  If hga1c>6.5 , please start fsbs ac and qhs  DVT Prophylaxis   Lovenox - SCDs  AM Labs Ordered, also please review Full Orders  Family Communication: Admission, patients condition and plan of care including tests being ordered have been discussed with the patient  who indicate understanding and agree with the plan and Code Status.  Code Status FULL CODE  Likely DC to  home  Condition GUARDED    Consults called:  Dr. JHoyt Kochby ED  Admission status:  inpatient  Time spent in minutes : 45   Jani Gravel M.D on 06/25/2017 at 9:27 PM  Between 7am to 7pm - Pager - [phone]  . After 7pm go to [email or web address] - password [facility]  [facility] - Office  [phone]

## 2017-06-25 NOTE — Progress Notes (Signed)
Pharmacy Antibiotic Note  Ashley EtienneChristina Donaldson is a 42 y.o. female admitted on 06/25/2017 with recurrent gum and throat swelling following a root canal on 05/18/17.  Pharmacy has been consulted for vancomycin and Zosyn dosing for r/o sepsis.  Afebrile, WBC 11.6, LA 3.04.  Plan: Vanc 2gm IV x 1, then 1gm IV Q8H Zosyn 3.375gm IV x 1, then EID 3.375gm IV Q8H Monitor renal fxn, clinical progress, vanc trough at C[facility]   Weight: 235 lb (106.6 kg)  Temp (24hrs), Avg:99.4 F (37.4 C), Min:99.4 F (37.4 C), Max:99.4 F (37.4 C)  Recent Labs  Lab 06/25/17 1044 06/25/17 1119  WBC 11.6*  --   CREATININE 0.58  --   LATICACIDVEN  --  3.04*    Estimated Creatinine Clearance: 105.2 mL/min (by C-G formula based on SCr of 0.58 mg/dL).    Allergies  Allergen Reactions  . Methylpyrrolidone Shortness Of Breath    [facility]/24/14 - pt not aware of this allergy  . Ciprofloxacin Rash  . Omni-Pac Rash    "cefdinir" Can take PCN per patient.  TDD. 06/2017.     Vanc 3/1 >> Zosyn 3/1 >>  3/1 BCx -    Lendell Gallick D. Laney Potashang, PharmD, BCPS Pager:  [phone] 06/25/2017, 1:20 PM

## 2017-06-25 NOTE — ED Provider Notes (Signed)
Medical Decision Making: Care of patient assumed Arthor CaptainAbigail Harris from  at 4:37 PM.  Agree with history, physical exam and plan.  See their note for further details.  Briefly, 42 y.o. female with PMH/PSH as below.  Past Medical History:  Diagnosis Date  . Anxiety   . Hypothyroidism   . Obesity   . Periapical abscess with facial involvement 05/31/2027   Past Surgical History:  Procedure Laterality Date  . COLON SURGERY  1994  . MULTIPLE EXTRACTIONS WITH ALVEOLOPLASTY N/A 06/01/2017   Procedure: MULTIPLE EXTRACTION WITH IRRIGATION AND DEBRIDEMENT;  Surgeon: Ocie DoyneJensen, Scott, DDS;  Location: [facility] OR;  Service: Oral Surgery;  Laterality: N/A;  . 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 antibiotics clindamycin now presents the emergency department after current second regimen of p.o. Antibiotics (clindamycin day 4) with worsening pain to the prior surgical site.  Patient arrives tachycardic afebrile, well-appearing with leukocytosis 11.6, elevated lactic acid 3.04, given 3LNS, now normal at 1.85 withlabs unremarkable.  Review of prior culture shows mixed flora.  Blood cultures collected patient empirically started on vancomycin/Zosyn.  Review of CT neck with contrast shows mild residual right lower face transfacial edema/cellulitis with no collection requiring drainage.  Current plan is as follows: OMF. Consulted Dr. Ocie DoyneScott Jensen.    Hospitalist will admit for failed po abx/cellulitits.  No drainable abscess. OMF will see inpt in the AM.   Procedures @LASTLABRESULT @ I personally reviewed and interpreted all labs.  Radiology Studies:  @IMGRESULTTODAYONLY @ Images viewed and used in my medical decision making. Formal interpretations by Radiology.   Jaynie Collinsugustin, Genice Kimberlin, DO 06/26/17 0001  Melene PlanFloyd, Dan, DO 06/26/17 0013

## 2017-06-26 DIAGNOSIS — E039 Hypothyroidism, unspecified: Secondary | ICD-10-CM

## 2017-06-26 DIAGNOSIS — E119 Type 2 diabetes mellitus without complications: Secondary | ICD-10-CM

## 2017-06-26 DIAGNOSIS — L03211 Cellulitis of face: Principal | ICD-10-CM

## 2017-06-26 LAB — CBC
HCT: 33.8 % — ABNORMAL LOW (ref 36.0–46.0)
HEMOGLOBIN: 10.3 g/dL — AB (ref 12.0–15.0)
MCH: 25.9 pg — ABNORMAL LOW (ref 26.0–34.0)
MCHC: 30.5 g/dL (ref 30.0–36.0)
MCV: 85.1 fL (ref 78.0–100.0)
Platelets: 323 10*3/uL (ref 150–400)
RBC: 3.97 MIL/uL (ref 3.87–5.11)
RDW: 16.9 % — ABNORMAL HIGH (ref 11.5–15.5)
WBC: 8.6 10*3/uL (ref 4.0–10.5)

## 2017-06-26 LAB — COMPREHENSIVE METABOLIC PANEL
ALK PHOS: 62 U/L (ref 38–126)
ALT: 13 U/L — ABNORMAL LOW (ref 14–54)
ANION GAP: 6 (ref 5–15)
AST: 19 U/L (ref 15–41)
Albumin: 2.5 g/dL — ABNORMAL LOW (ref 3.5–5.0)
BILIRUBIN TOTAL: 0.6 mg/dL (ref 0.3–1.2)
BUN: <5 mg/dL — ABNORMAL LOW (ref 6–20)
CALCIUM: 8.1 mg/dL — AB (ref 8.9–10.3)
CO2: 23 mmol/L (ref 22–32)
Chloride: 110 mmol/L (ref 101–111)
Creatinine, Ser: 0.51 mg/dL (ref 0.44–1.00)
GFR calc non Af Amer: >60 mL/min (ref >60)
Glucose, Bld: 150 mg/dL — ABNORMAL HIGH (ref 65–99)
POTASSIUM: 3.4 mmol/L — AB (ref 3.5–5.1)
Sodium: 139 mmol/L (ref 135–145)
TOTAL PROTEIN: 4.8 g/dL — AB (ref 6.5–8.1)

## 2017-06-26 LAB — GLUCOSE, CAPILLARY
GLUCOSE-CAPILLARY: 162 mg/dL — AB (ref 65–99)
GLUCOSE-CAPILLARY: 133 mg/dL — AB (ref 65–99)

## 2017-06-26 LAB — HEMOGLOBIN A1C
HEMOGLOBIN A1C: 7.3 % — AB (ref 4.8–5.6)
MEAN PLASMA GLUCOSE: 162.81 mg/dL

## 2017-06-26 LAB — SEDIMENTATION RATE: SED RATE: 25 mm/h — AB (ref 0–22)

## 2017-06-26 MED ORDER — LIVING WELL WITH DIABETES BOOK
Freq: Once | Status: AC
  Administered 2017-06-26: 18:00:00
  Filled 2017-06-26: qty 1

## 2017-06-26 MED ORDER — INSULIN ASPART 100 UNIT/ML ~~LOC~~ SOLN
0.0000 [IU] | Freq: Three times a day (TID) | SUBCUTANEOUS | Status: DC
  Administered 2017-06-26: 4 [IU] via SUBCUTANEOUS
  Administered 2017-06-27: 3 [IU] via SUBCUTANEOUS
  Administered 2017-06-27: 4 [IU] via SUBCUTANEOUS

## 2017-06-26 MED ORDER — INSULIN ASPART 100 UNIT/ML ~~LOC~~ SOLN: 0.0000 [IU] | Freq: Every day | SUBCUTANEOUS | Status: DC

## 2017-06-26 MED ORDER — POTASSIUM CHLORIDE CRYS ER 20 MEQ PO TBCR
60.0000 meq | EXTENDED_RELEASE_TABLET | Freq: Once | ORAL | Status: AC
  Administered 2017-06-26: 60 meq via ORAL
  Filled 2017-06-26: qty 3

## 2017-06-26 NOTE — Progress Notes (Signed)
Patient ID: Ashley Donaldson, female   DOB: 12-Feb-1976, 42 y.o.   MRN: [id number]  PROGRESS NOTE    Ashley Donaldson  WGN:[id number] DOB: 12-Feb-1976 DOA: 06/25/2017 [facility]   Brief Narrative:  42 year old female with history of hypothyroidism, anxiety who had tooth extraction on 06/01/2017 and subsequently was treated with clindamycin for postoperative pain and mild swelling.  Patient was admitted on 06/25/2017 with fever and facial swelling and pain in the jaw bone.  She was started on intravenous antibiotics.  CT scan did not show any residual or recurrent abscess.   Assessment & Plan:   Active Problems:   Hypothyroidism   Cellulitis   Facial cellulitis   Right facial cellulitis -Currently on vancomycin and Zosyn.  Patient was treated with clindamycin as an outpatient. -Follow cultures. -CT scan did not show any residual or recurrent abscess -Oral surgery consulted -Repeat a.m. 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  Consult diabetes coordinator -Consult dietitian for instructions regarding carb modified diet  Morbid obesity -Outpatient follow-up   DVT prophylaxis: Lovenox Code Status:   Full Family Communication: Spoke to patient at bedside.  No family members at bedside Disposition Plan: Home in 1-2 days  Consultants: Oral surgery  Procedures: None  Antimicrobials: Vancomycin and Zosyn from 06/25/2017  Subjective: Patient seen and examined at bedside.  She feels much better.  Her right facial pain is improving.  No overnight fever or vomiting  Objective: Vitals:   06/25/17 1349 06/25/17 1900 06/25/17 2100 06/26/17 0455  BP: 123/82 (!) 108/58 105/74 (!) 98/58  Pulse: 91 80 87 76  Resp: 12 17 18 18   Temp:  97.8 F (36.6 C) 98.2 F (36.8 C) 98.2 F (36.8 C)  TempSrc:  Oral Oral Oral  SpO2: 100% 98%  98% 98%  Weight:  106.6 kg (235 lb)  106.6 kg (235 lb)  Height:  5\' 1"  (1.549 m)      Intake/Output Summary (Last 24 hours) at 06/26/2017 1252 Last data filed at 06/25/2017 1914 Gross per 24 hour  Intake 3400 ml  Output -  Net 3400 ml   Filed Weights   06/25/17 1300 06/25/17 1900 06/26/17 0455  Weight: 106.6 kg (235 lb) 106.6 kg (235 lb) 106.6 kg (235 lb)    Examination:  General exam: Appears calm and comfortable.  No acute distress ENT: Mildly swollen right face with not much erythema Respiratory system: Bilateral decreased breath sound at bases Cardiovascular system: S1 & S2 heard, controlled  gastrointestinal system: Abdomen is morbidly obese, nondistended, soft and nontender. Normal bowel sounds heard. Central nervous system: Alert and oriented. No focal neurological deficits. Moving extremities Extremities: No cyanosis, clubbing, edema  Skin: No rashes, lesions or ulcers     Data Reviewed: I have personally reviewed following labs and imaging studies  CBC: Recent Labs  Lab 06/25/17 1044 06/26/17 0325  WBC 11.6* 8.6  NEUTROABS 7.1  --   HGB 12.4 10.3*  HCT 39.9 33.8*  MCV 85.1 85.1  PLT 374 323   Basic Metabolic Panel: Recent Labs  Lab 06/25/17 1044 06/26/17 0325  NA 138 139  K 3.6 3.4*  CL 104 110  CO2 21* 23  GLUCOSE 145* 150*  BUN 6 <5*  CREATININE 0.58 0.51  CALCIUM 9.0 8.1*   GFR: Estimated Creatinine Clearance: 105.2 mL/min (by C-G formula based on SCr of 0.51 mg/dL). Liver Function Tests: Recent Labs  Lab 06/25/17 1044 06/26/17 0325  AST 31 19  ALT 19 13*  ALKPHOS 77 62  BILITOT 0.6 0.6  PROT 6.3* 4.8*  ALBUMIN 3.4* 2.5*   No results for input(s): LIPASE, AMYLASE in the last 168 hours. No results for input(s): AMMONIA in the last 168 hours. Coagulation Profile: No results for input(s): INR, PROTIME in the last 168 hours. Cardiac Enzymes: No results for input(s): CKTOTAL, CKMB, CKMBINDEX, TROPONINI in the last 168 hours. BNP (last  3 results) No results for input(s): PROBNP in the last 8760 hours. HbA1C: Recent Labs    06/26/17 0325  HGBA1C 7.3*   CBG: No results for input(s): GLUCAP in the last 168 hours. Lipid Profile: No results for input(s): CHOL, HDL, LDLCALC, TRIG, CHOLHDL, LDLDIRECT in the last 72 hours. Thyroid Function Tests: No results for input(s): TSH, T4TOTAL, FREET4, T3FREE, THYROIDAB in the last 72 hours. Anemia Panel: No results for input(s): VITAMINB12, FOLATE, FERRITIN, TIBC, IRON, RETICCTPCT in the last 72 hours. Sepsis Labs: Recent Labs  Lab 06/25/17 1119 06/25/17 1326 06/25/17 1559  LATICACIDVEN 3.04* 2.64* 1.85    No results found for this or any previous visit (from the past 240 hour(s)).       Radiology Studies: Dg Orthopantogram  Result Date: 06/25/2017 CLINICAL DATA:  Right-sided jaw pain and swelling. EXAM: ORTHOPANTOGRAM/PANORAMIC COMPARISON:  None. FINDINGS: No fracture or other bony abnormality is noted. No lytic lesion is noted. IMPRESSION: No abnormality seen involving the mandible. Electronically Signed   By: Lupita Raider, M.D.   On: 06/25/2017 21:16   Ct Soft Tissue Neck W Contrast  Result Date: 06/25/2017 CLINICAL DATA:  Persistent swelling and low-grade fevers after root canal in January. Evaluate RIGHT neck and jaw pain. History of Graves disease. EXAM: CT NECK WITH CONTRAST TECHNIQUE: Multidetector CT imaging of the neck was performed using the standard protocol following the bolus administration of intravenous contrast. CONTRAST:  75mL ISOVUE-300 IOPAMIDOL (ISOVUE-300) INJECTION 61% COMPARISON:  CT neck May 28, 2017 FINDINGS: PHARYNX AND LARYNX: Normal.  Widely patent airway. SALIVARY GLANDS: Mild RIGHT submandibular gland inflammation without sialolith, ductal dilatation or mass. THYROID: Normal. LYMPH NODES: No lymphadenopathy by CT size criteria. VASCULAR: Normal. LIMITED INTRACRANIAL: Normal. VISUALIZED ORBITS: Normal. MASTOIDS AND VISUALIZED PARANASAL  SINUSES: Well-aerated. SKELETON: Scattered dental caries. Interval extraction of approximate tooth 31. Again seen is tooth 9 periapical abscess, trace potential tooth 10 periapical abscess. UPPER CHEST: Lung apices are clear. No superior mediastinal lymphadenopathy. OTHER: Thickened platysma and, mildly edematous RIGHT mild asymmetrically enlarged RIGHT belly of the digastric, RIGHT mylohyoid and RIGHT genioglossus muscles. No residual or recurrent RIGHT lower face all abscess. Mild residual RIGHT floor of mouth and RIGHT lower face soft tissue swelling. IMPRESSION: 1. Mild residual RIGHT lower face transspatial edema/cellulitis. No residual or recurrent abscess. Status post interval extraction approximate tooth 31. 2. Patent airway. Electronically Signed   By: Awilda Metro M.D.   On: 06/25/2017 16:51        Scheduled Meds: . chlorhexidine  15 mL Mouth/Throat BID  . enoxaparin (LOVENOX) injection  40 mg Subcutaneous Q24H  . levothyroxine  175 mcg Oral QAC breakfast   Continuous Infusions: . piperacillin-tazobactam (ZOSYN)  IV 3.375 g (06/26/17 1231)  . vancomycin Stopped (06/26/17 0701)     LOS: 1 day        Glade Lloyd, MD [facility] Pager [phone]  If 7PM-7AM, please contact night-coverage [email or web address] Password [facility] 06/26/2017, 12:52 PM

## 2017-06-26 NOTE — Plan of Care (Addendum)
NUTRITION EDUCATION NOTE  RD consulted for nutrition education regarding diabetes.  Lab Results  Component Value Date   HGBA1C 7.3 (H) 06/26/2017   Went through dietary recall: Breakfast: Skips AM snack/lunch: ~10 am eats chips/ PB with crackers. Orders delivery Dinner: Take out: "9x out of 10 it is a salad", or grilled cheese, or pizza Bevs: Sweet tea a couple x a week, Mt dew- approximately 2/day, juice periodically Behaviors: Doesn't read labels. Sleeps in late and gets up too late to eat breakfast. Orders most of foods from restaurants Physical Activity-essentially none. Does go fishing often in the summer times.   First and foremost, the patient consumes atleast 2 regular soda/day. She actually has a 12 pack of mt dew bottles on her tray table. Showed that there are 60g sugar/bottle. RD reviewed how sodas affect the body and are number one cause of obesity/DM. She asks "what if I changed to mellow yellow". Explained it is in her best interest to switch to unsweetened beverages ie unsweet coffee/tea, milk, water w/ flavor enhancers or diet soda. She could try 50% sweet/50% unsweet tea or 50% regular/50% diet soda as a way to wean herself off soda.   Secondly, she is essentially eating only 1 meal/day. She shares an anecdote of how she had been given a diet plan for 1600 kcals/day for wt loss, but she says she has never eaten this much. RD explained how it is quite common for individuals who are overweight/obese to eat very infrequently. Because she eats so infrequently, her body is more apt to "hold onto" its reserves. She needs to eat more frequently to lose weight and she should never skip meals. We discussed appropriate breakfast options  RD gave handout of the plate method. Explained that, ideally, the patients meals should be 25-50% veg, 25-50% protein and 25% carb. Detailed how fiber/protein help control blood sugars.  She does not read nutritional labels. RD showed picture of label  and asked her to chjoose items that are high in protein/fiber, but low in total carbs/sugars. Noted the importance of checking portion size.   Physical Activity can help lower BG. She sounds to be essentially inactive, though she goes fishing during the summer and says this includes walking. Advocated for increased activity.   RD provided handouts titled "Type 2 Diabetes Nutrition Therapy" and "Diabetes Label Reading Tips" from the Academy of Nutrition and Dietetics as well as a copy of the Diabetic "My plate".  Summary of Recommendations 1. Eliminate consumption of regular sodas. Switch to diet soda, unsweet coffee/tea, milk, flavored water. Can switch to 50% reg 50% diet/unsweet beverages as stepping stone 2. Dont skip meals! Eat atleast 3x/day 3. Follow the Plate Method- aim for 25-50% protein, 25-50% veg and 25% carb at meals 4. Physical activity as able.  5. Read labels. Choose items high in fiber/protein and low in total carbs/sugars. Aim for 60-80g/carb per meal  Expect Fair compliance. Pt was receptive to information. Asked appropriate questions. However, she has several deeply engrained habits that will likely be difficult for her to break, such as eating essentially only 1x/day.   Body mass index is 44.4 kg/m. Pt meets criteria for Morbidly obese based on current BMI.  No further nutrition interventions warranted at this time. If additional nutrition issues arise, please re-consult RD.  Christophe LouisNathan Joda Braatz RD, LDN, CNSC Clinical Nutrition Pager: [phone] 06/26/2017 3:47 PM

## 2017-06-26 NOTE — Consult Note (Signed)
Reason for Consult:right facial cellulitis, sepsis Referring Physician:   [name] is an 42 y.o. 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 Underwent dental extraction, Incision and drainage 06/01/2017. Discharged 06/04/17 much improved. Developed post-operative pain and mild swelling and placed on clindamycin. Condition worsened and PCP recommended ER eval. Patient feels much improved on Vanc/Zosyn.  Past Medical History:  Diagnosis Date  . Anxiety   . Hypothyroidism   . Obesity   . Periapical abscess with facial involvement 05/31/2027    Past Surgical History:  Procedure Laterality Date  . COLON SURGERY  1994  . MULTIPLE EXTRACTIONS WITH ALVEOLOPLASTY N/A 06/01/2017   Procedure: MULTIPLE EXTRACTION WITH IRRIGATION AND DEBRIDEMENT;  Surgeon: Ashley Donaldson, DDS;  Location: [facility];  Service: Oral Surgery;  Laterality: N/A;  . ROOT CANAL      Family History  Problem Relation Age of Onset  . Thyroid disease Mother   . Heart attack Father     Social History:  reports that she has been smoking cigarettes.  She has a 2.50 pack-year smoking history. she has never used smokeless tobacco. She reports that she does not drink alcohol or use drugs.  Allergies:  Allergies  Allergen Reactions  . Methylpyrrolidone Shortness Of Breath    Reported by [facility] 04/24/14 - pt not aware of this allergy  . Ciprofloxacin Rash  . Omni-Pac Rash    "cefdinir" Can take PCN per patient.  TDD. 06/2017.    Medications: I have reviewed the patient's current medications.  Results for orders placed or performed during the hospital encounter of 06/25/17 (from the past 48 hour(s))  Comprehensive metabolic panel     Status: Abnormal   Collection Time: 06/25/17 10:44 AM  Result Value Ref Range   Sodium 138 135 - 145 mmol/L   Potassium 3.6 3.5 - 5.1 mmol/L   Chloride 104 101 - 111 mmol/L   CO2 21 (L) 22 - 32 mmol/L   Glucose, Bld 145 (H)  65 - 99 mg/dL   BUN 6 6 - 20 mg/dL   Creatinine, Ser 0.58 0.44 - 1.00 mg/dL   Calcium 9.0 8.9 - 10.3 mg/dL   Total Protein 6.3 (L) 6.5 - 8.1 g/dL   Albumin 3.4 (L) 3.5 - 5.0 g/dL   AST 31 15 - 41 U/L   ALT 19 14 - 54 U/L   Alkaline Phosphatase 77 38 - 126 U/L   Total Bilirubin 0.6 0.3 - 1.2 mg/dL   GFR calc non Af Amer >60 >60 mL/min   GFR calc Af Amer >60 >60 mL/min    Comment: (NOTE) The eGFR has been calculated using the CKD EPI equation. This calculation has not been validated in all clinical situations. eGFR's persistently <60 mL/min signify possible Chronic Kidney Disease.    Anion gap 13 5 - 15    Comment: Performed at [facility] [address], [place], [place] [address]  CBC with Differential     Status: Abnormal   Collection Time: 06/25/17 10:44 AM  Result Value Ref Range   WBC 11.6 (H) 4.0 - 10.5 K/uL   RBC 4.69 3.87 - 5.11 MIL/uL   Hemoglobin 12.4 12.0 - 15.0 g/dL   HCT 39.9 36.0 - 46.0 %   MCV 85.1 78.0 - 100.0 fL   MCH 26.4 26.0 - 34.0 pg   MCHC 31.1 30.0 - 36.0 g/dL   RDW 16.9 (H) 11.5 - 15.5 %  Platelets 374 150 - 400 K/uL   Neutrophils Relative % 62 %   Neutro Abs 7.1 1.7 - 7.7 K/uL   Lymphocytes Relative 31 %   Lymphs Abs 3.6 0.7 - 4.0 K/uL   Monocytes Relative 6 %   Monocytes Absolute 0.7 0.1 - 1.0 K/uL   Eosinophils Relative 1 %   Eosinophils Absolute 0.1 0.0 - 0.7 K/uL   Basophils Relative 0 %   Basophils Absolute 0.1 0.0 - 0.1 K/uL    Comment: Performed at [facility] [address] [place], [place] [address]  Urinalysis, Routine w reflex microscopic     Status: Abnormal   Collection Time: 06/25/17 10:44 AM  Result Value Ref Range   Color, Urine YELLOW YELLOW   APPearance CLEAR CLEAR   Specific Gravity, Urine 1.008 1.005 - 1.030   pH 6.0 5.0 - 8.0   Glucose, UA NEGATIVE NEGATIVE mg/dL   Hgb urine dipstick SMALL (A) NEGATIVE   Bilirubin Urine NEGATIVE NEGATIVE   Ketones, ur NEGATIVE NEGATIVE mg/dL   Protein, ur NEGATIVE  NEGATIVE mg/dL   Nitrite NEGATIVE NEGATIVE   Leukocytes, UA NEGATIVE NEGATIVE   RBC / HPF 0-5 0 - 5 RBC/hpf   WBC, UA 0-5 0 - 5 WBC/hpf   Bacteria, UA FEW (A) NONE SEEN   Squamous Epithelial / LPF 0-5 (A) NONE SEEN   Mucus PRESENT     Comment: Performed at [facility], [place] [address] [place], [place] [address]  I-Stat beta hCG blood, ED     Status: None   Collection Time: 06/25/17 11:16 AM  Result Value Ref Range   I-stat hCG, quantitative <5.0 <5 mIU/mL   Comment 3            Comment:   GEST. AGE      CONC.  (mIU/mL)   <=1 WEEK        5 - 50     2 WEEKS       50 - 500     3 WEEKS       100 - 10,000     4 WEEKS     1,000 - 30,000        FEMALE AND NON-PREGNANT FEMALE:     LESS THAN 5 mIU/mL   I-Stat CG4 Lactic Acid, ED     Status: Abnormal   Collection Time: 06/25/17 11:19 AM  Result Value Ref Range   Lactic Acid, Venous 3.04 (HH) 0.5 - 1.9 mmol/L   Comment NOTIFIED PHYSICIAN   I-Stat CG4 Lactic Acid, ED     Status: Abnormal   Collection Time: 06/25/17  1:26 PM  Result Value Ref Range   Lactic Acid, Venous 2.64 (HH) 0.5 - 1.9 mmol/L   Comment NOTIFIED PHYSICIAN   I-Stat CG4 Lactic Acid, ED  (not at  [facility])     Status: None   Collection Time: 06/25/17  3:59 PM  Result Value Ref Range   Lactic Acid, Venous 1.85 0.5 - 1.9 mmol/L  Comprehensive metabolic panel     Status: Abnormal   Collection Time: 06/26/17  3:25 AM  Result Value Ref Range   Sodium 139 135 - 145 mmol/L   Potassium 3.4 (L) 3.5 - 5.1 mmol/L   Chloride 110 101 - 111 mmol/L   CO2 23 22 - 32 mmol/L   Glucose, Bld 150 (H) 65 - 99 mg/dL   BUN <5 (L) 6 - 20 mg/dL   Creatinine, Ser 0.51 0.44 - 1.00 mg/dL  Calcium 8.1 (L) 8.9 - 10.3 mg/dL   Total Protein 4.8 (L) 6.5 - 8.1 g/dL   Albumin 2.5 (L) 3.5 - 5.0 g/dL   AST 19 15 - 41 U/L   ALT 13 (L) 14 - 54 U/L   Alkaline Phosphatase 62 38 - 126 U/L   Total Bilirubin 0.6 0.3 - 1.2 mg/dL   GFR calc non Af Amer >60 >60 mL/min   GFR calc Af Amer >60 >60 mL/min     Comment: (NOTE) The eGFR has been calculated using the CKD EPI equation. This calculation has not been validated in all clinical situations. eGFR's persistently <60 mL/min signify possible Chronic Kidney Disease.    Anion gap 6 5 - 15    Comment: Performed at [facility] [address] [place], [place] [address]  CBC     Status: Abnormal   Collection Time: 06/26/17  3:25 AM  Result Value Ref Range   WBC 8.6 4.0 - 10.5 K/uL   RBC 3.97 3.87 - 5.11 MIL/uL   Hemoglobin 10.3 (L) 12.0 - 15.0 g/dL   HCT 33.8 (L) 36.0 - 46.0 %   MCV 85.1 78.0 - 100.0 fL   MCH 25.9 (L) 26.0 - 34.0 pg   MCHC 30.5 30.0 - 36.0 g/dL   RDW 16.9 (H) 11.5 - 15.5 %   Platelets 323 150 - 400 K/uL    Comment: Performed at [facility], [place] [address] [place], [place] [address]  Sedimentation rate     Status: Abnormal   Collection Time: 06/26/17  3:25 AM  Result Value Ref Range   Sed Rate 25 (H) 0 - 22 mm/hr    Comment: Performed at [facility] [address], [place], [place] [address]  Hemoglobin A1c     Status: Abnormal   Collection Time: 06/26/17  3:25 AM  Result Value Ref Range   Hgb A1c MFr Bld 7.3 (H) 4.8 - 5.6 %    Comment: (NOTE) Pre diabetes:          5.7%-6.4% Diabetes:              >6.4% Glycemic control for   <7.0% adults with diabetes    Mean Plasma Glucose 162.81 mg/dL    Comment: Performed at [facility] [address] [place], [place] [address]    Dg Orthopantogram  Result Date: 06/25/2017 CLINICAL DATA:  Right-sided jaw pain and swelling. EXAM: ORTHOPANTOGRAM/PANORAMIC COMPARISON:  None. FINDINGS: No fracture or other bony abnormality is noted. No lytic lesion is noted. IMPRESSION: No abnormality seen involving the mandible. Electronically Signed   By: Marijo Conception, M.D.   On: 06/25/2017 21:16   Ct Soft Tissue Neck W Contrast  Result Date: 06/25/2017 CLINICAL DATA:  Persistent swelling and low-grade fevers after root canal in January. Evaluate RIGHT  neck and jaw pain. History of Graves disease. EXAM: CT NECK WITH CONTRAST TECHNIQUE: Multidetector CT imaging of the neck was performed using the standard protocol following the bolus administration of intravenous contrast. CONTRAST:  41m ISOVUE-300 IOPAMIDOL (ISOVUE-300) INJECTION 61% COMPARISON:  CT neck May 28, 2017 FINDINGS: PHARYNX AND LARYNX: Normal.  Widely patent airway. SALIVARY GLANDS: Mild RIGHT submandibular gland inflammation without sialolith, ductal dilatation or mass. THYROID: Normal. LYMPH NODES: No lymphadenopathy by CT size criteria. VASCULAR: Normal. LIMITED INTRACRANIAL: Normal. VISUALIZED ORBITS: Normal. MASTOIDS AND VISUALIZED PARANASAL SINUSES: Well-aerated. SKELETON: Scattered dental caries. Interval extraction of approximate tooth 31. Again seen is tooth 9 periapical  abscess, trace potential tooth 10 periapical abscess. UPPER CHEST: Lung apices are clear. No superior mediastinal lymphadenopathy. OTHER: Thickened platysma and, mildly edematous RIGHT mild asymmetrically enlarged RIGHT belly of the digastric, RIGHT mylohyoid and RIGHT genioglossus muscles. No residual or recurrent RIGHT lower face all abscess. Mild residual RIGHT floor of mouth and RIGHT lower face soft tissue swelling. IMPRESSION: 1. Mild residual RIGHT lower face transspatial edema/cellulitis. No residual or recurrent abscess. Status post interval extraction approximate tooth 31. 2. Patent airway. Electronically Signed   By: Elon Alas M.D.   On: 06/25/2017 16:51    ROS Blood pressure (!) 98/58, pulse 76, temperature 98.2 F (36.8 C), temperature source Oral, resp. rate 18, height 5' 1"  (1.549 m), weight 235 lb (106.6 kg), last menstrual period 05/30/2017, SpO2 98 %. General appearance: alert, cooperative and no distress Head: Normocephalic, without obvious abnormality, atraumatic Eyes: negative Nose: Nares normal. Septum midline. Mucosa normal. No drainage or sinus tenderness. Throat: lips, mucosa, and  tongue normal; teeth and gums normal and Small bony sequestrum left lingual posterior mandible;No oral edema, fluctuance, trismus. FOM without tenderness. No dental caries noted. Neck: no adenopathy, supple, symmetrical, trachea midline, thyroid not enlarged, symmetric, no tenderness/mass/nodules and Mild right mandibular induration, skin retraction at I and D site. No fluctuance or purulent drainage  Assessment/Plan: Facial cellulitis.Patient improving on abx regimen. No drainable absces. Drug cultures pending.  Appears stable for D/C  if on comparable antibiotic therapy. Will re-eval in am.   Ashley Donaldson 06/26/2017, 10:35 AM

## 2017-06-27 LAB — CBC WITH DIFFERENTIAL/PLATELET
BASOS PCT: 0 %
Basophils Absolute: 0 10*3/uL (ref 0.0–0.1)
EOS ABS: 0.1 10*3/uL (ref 0.0–0.7)
EOS PCT: 2 %
HCT: 37 % (ref 36.0–46.0)
Hemoglobin: 11.4 g/dL — ABNORMAL LOW (ref 12.0–15.0)
LYMPHS ABS: 2.8 10*3/uL (ref 0.7–4.0)
Lymphocytes Relative: 32 %
MCH: 26.3 pg (ref 26.0–34.0)
MCHC: 30.8 g/dL (ref 30.0–36.0)
MCV: 85.3 fL (ref 78.0–100.0)
MONOS PCT: 6 %
Monocytes Absolute: 0.6 10*3/uL (ref 0.1–1.0)
NEUTROS PCT: 60 %
Neutro Abs: 5.1 10*3/uL (ref 1.7–7.7)
Platelets: 351 10*3/uL (ref 150–400)
RBC: 4.34 MIL/uL (ref 3.87–5.11)
RDW: 16.8 % — ABNORMAL HIGH (ref 11.5–15.5)
WBC: 8.6 10*3/uL (ref 4.0–10.5)

## 2017-06-27 LAB — BASIC METABOLIC PANEL
ANION GAP: 9 (ref 5–15)
CALCIUM: 8.7 mg/dL — AB (ref 8.9–10.3)
CO2: 23 mmol/L (ref 22–32)
Chloride: 106 mmol/L (ref 101–111)
Creatinine, Ser: 0.6 mg/dL (ref 0.44–1.00)
GFR calc Af Amer: >60 mL/min (ref >60)
GLUCOSE: 149 mg/dL — AB (ref 65–99)
POTASSIUM: 4 mmol/L (ref 3.5–5.1)
SODIUM: 138 mmol/L (ref 135–145)

## 2017-06-27 LAB — GLUCOSE, CAPILLARY
GLUCOSE-CAPILLARY: 131 mg/dL — AB (ref 65–99)
Glucose-Capillary: 162 mg/dL — ABNORMAL HIGH (ref 65–99)

## 2017-06-27 LAB — MAGNESIUM: MAGNESIUM: 1.9 mg/dL (ref 1.7–2.4)

## 2017-06-27 MED ORDER — AMOXICILLIN-POT CLAVULANATE 875-125 MG PO TABS: 1.0000 | ORAL_TABLET | Freq: Two times a day (BID) | ORAL | 0 refills | Status: AC

## 2017-06-27 MED ORDER — DOXYCYCLINE HYCLATE 100 MG PO TABS: 100.0000 mg | ORAL_TABLET | Freq: Two times a day (BID) | ORAL | 0 refills | Status: AC

## 2017-06-27 MED ORDER — LIVING WELL WITH DIABETES BOOK
Freq: Once | Status: AC
  Administered 2017-06-27: 10:00:00
  Filled 2017-06-27: qty 1

## 2017-06-27 NOTE — Discharge Summary (Signed)
Physician Discharge Summary  Ashley Donaldson ZOX:[id number] DOB: 11/22/1975 DOA: 06/25/2017  PCP: [facility]  Admit date: 06/25/2017 Discharge date: 06/27/2017  Admitted From: Home Disposition:  Home  Recommendations for Outpatient Follow-up:  1. Follow up with PCP in 1 week 2. Follow-up with oral surgery/Dr. Barbette Merino next week   Home Health: No Equipment/Devices: None  Discharge Condition: Stable CODE STATUS: Full Diet recommendation: Heart Healthy / Carb Modified   Brief/Interim Summary: 42 year old female with history of hypothyroidism, anxiety who had tooth extraction on 06/01/2017 and subsequently was treated with clindamycin for postoperative pain and mild swelling.  Patient was admitted on 06/25/2017 with fever and facial swelling and pain in the jaw bone.  She was started on intravenous antibiotics.  CT scan did not show any residual or recurrent abscess.  Patient was seen by oral surgery/Dr. Barbette Merino.  Her condition improved.  No temperature spikes; no worsening leukocytosis.  Oral surgery has cleared the patient for discharge.  Patient will be discharged on Augmentin and doxycycline.  She was also found to have new diagnosis of diabetes mellitus for which she will have to follow-up with primary care provider.   Discharge Diagnoses:  Active Problems:   Hypothyroidism   Cellulitis   Facial cellulitis  Right facial cellulitis -Currently on vancomycin and Zosyn.  Patient was treated with clindamycin as an outpatient. -CT scan did not show any residual or recurrent abscess -Oral surgery has evaluated the patient and has cleared the patient for discharge. -No leukocytosis and no temperature spikes.  Discharge patient on oral Augmentin and doxycycline for a week.  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  Patient will follow up with primary care provider regarding initiation of oral medications.  Morbid obesity -Outpatient follow-up  Discharge Instructions   Allergies as of 06/27/2017      Reactions   Methylpyrrolidone Shortness Of Breath   Reported by [facility] 04/24/14 - pt not aware of this allergy   Ciprofloxacin Rash   Omni-pac Rash   "cefdinir" Can take PCN per patient.  TDD. 06/2017.      Medication List    STOP taking these medications   clindamycin 300 MG capsule Commonly known as:  CLEOCIN   HYDROcodone-acetaminophen 5-325 MG tablet Commonly known as:  NORCO/VICODIN     TAKE these medications   acetaminophen 500 MG tablet Commonly known as:  TYLENOL Take 500 mg by mouth every 6 (six) hours as needed for headache (pain).   ALPRAZolam 0.5 MG tablet Commonly known as:  XANAX Take 0.5 mg by mouth daily as needed for anxiety.   amoxicillin-clavulanate 875-125 MG tablet Commonly known as:  AUGMENTIN Take 1 tablet by mouth 2 (two) times daily for 7 days.   chlorhexidine 0.12 % solution Commonly known as:  PERIDEX Use as directed 15 mLs in the mouth or throat 2 (two) times daily.   doxycycline 100 MG tablet Commonly known as:  VIBRA-TABS Take 1 tablet (100 mg total) by mouth 2 (two) times daily for 7 days.   ibuprofen 200 MG tablet Commonly known as:  ADVIL,MOTRIN Take 400 mg by mouth every 6 (six) hours as needed (pain).   levothyroxine 175 MCG tablet Commonly known as:  SYNTHROID, LEVOTHROID Take 175 mcg by mouth daily before breakfast.  MUCINEX MAXIMUM STRENGTH 1200 MG Tb12 Generic drug:  Guaifenesin Take 1,200 mg by mouth 2 (two) times daily as needed (sinus headache).      Follow-up Information    [facility]. Schedule an appointment as soon as possible for a visit in 1 week(s).   Specialty:  Family  Medicine       Ocie Doyne, DDS Follow up.   Specialty:  Oral Surgery Why:  within a week Contact information: [address] [place] [address] [phone]          Allergies  Allergen Reactions  . Methylpyrrolidone Shortness Of Breath    Reported by [facility] 04/24/14 - pt not aware of this allergy  . Ciprofloxacin Rash  . Omni-Pac Rash    "cefdinir" Can take PCN per patient.  TDD. 06/2017.    Consultations:  Oral surgery   Procedures/Studies: Dg Orthopantogram  Result Date: 06/25/2017 CLINICAL DATA:  Right-sided jaw pain and swelling. EXAM: ORTHOPANTOGRAM/PANORAMIC COMPARISON:  None. FINDINGS: No fracture or other bony abnormality is noted. No lytic lesion is noted. IMPRESSION: No abnormality seen involving the mandible. Electronically Signed   By: Lupita Raider, M.D.   On: 06/25/2017 21:16   Ct Soft Tissue Neck W Contrast  Result Date: 06/25/2017 CLINICAL DATA:  Persistent swelling and low-grade fevers after root canal in January. Evaluate RIGHT neck and jaw pain. History of Graves disease. EXAM: CT NECK WITH CONTRAST TECHNIQUE: Multidetector CT imaging of the neck was performed using the standard protocol following the bolus administration of intravenous contrast. CONTRAST:  75mL ISOVUE-300 IOPAMIDOL (ISOVUE-300) INJECTION 61% COMPARISON:  CT neck May 28, 2017 FINDINGS: PHARYNX AND LARYNX: Normal.  Widely patent airway. SALIVARY GLANDS: Mild RIGHT submandibular gland inflammation without sialolith, ductal dilatation or mass. THYROID: Normal. LYMPH NODES: No lymphadenopathy by CT size criteria. VASCULAR: Normal. LIMITED INTRACRANIAL: Normal. VISUALIZED ORBITS: Normal. MASTOIDS AND VISUALIZED PARANASAL SINUSES: Well-aerated. SKELETON: Scattered dental caries. Interval extraction of approximate tooth 31. Again seen is tooth 9 periapical abscess, trace potential tooth 10 periapical abscess. UPPER CHEST: Lung apices are clear. No superior mediastinal  lymphadenopathy. OTHER: Thickened platysma and, mildly edematous RIGHT mild asymmetrically enlarged RIGHT belly of the digastric, RIGHT mylohyoid and RIGHT genioglossus muscles. No residual or recurrent RIGHT lower face all abscess. Mild residual RIGHT floor of mouth and RIGHT lower face soft tissue swelling. IMPRESSION: 1. Mild residual RIGHT lower face transspatial edema/cellulitis. No residual or recurrent abscess. Status post interval extraction approximate tooth 31. 2. Patent airway. Electronically Signed   By: Awilda Metro M.D.   On: 06/25/2017 16:51   Ct Soft Tissue Neck W Contrast  Result Date: 05/28/2017 CLINICAL DATA:  RIGHT facial swelling after root canal 10 days ago. On antibiotics without improvement. History of Graves disease. EXAM: CT NECK WITH CONTRAST TECHNIQUE: Multidetector CT imaging of the neck was performed using the standard protocol following the bolus administration of intravenous contrast. CONTRAST:  75mL ISOVUE-300 IOPAMIDOL (ISOVUE-300) INJECTION 61% COMPARISON:  None. FINDINGS: PHARYNX AND LARYNX: Mild RIGHT peritonsillar inflammation. Normal larynx. Normal epiglottis. SALIVARY GLANDS: Normal. THYROID: Normal. LYMPH NODES: Prominent though not pathologically enlarged RIGHT level 1 B lymph nodes are likely reactive. VASCULAR: Normal. LIMITED INTRACRANIAL: Normal. VISUALIZED ORBITS: Normal. MASTOIDS AND VISUALIZED PARANASAL SINUSES: Well-aerated. SKELETON: Scattered dental caries. Tooth 9 and approximate tooth 31 periapical abscess with osseous dehiscence and cortical disruption. UPPER CHEST: Lung apices are clear. No superior mediastinal lymphadenopathy. OTHER: 2.9 x 3.3 x 3.2 cm rim enhancing fluid collection contiguous with  body of the RIGHT mandible. RIGHT lower face free fluid extending into the RIGHT submandibular space, RIGHT parotid space with platysma thickening. RIGHT floor of mouth effusion. No subcutaneous gas or radiopaque foreign bodies. Inflammatory changes RIGHT  retromolar trigone, RIGHT parapharyngeal fat planes inferiorly. IMPRESSION: 1. Approximate tooth 31 periapical abscess with osseous dehiscence. 2.9 x 3.3 x 3.2 cm associated RIGHT body of the mandible subperiosteal abscess. RIGHT lower face cellulitis, transspatial edema and effusion. Electronically Signed   By: Awilda Metroourtnay  Bloomer M.D.   On: 05/28/2017 23:23     Subjective: Patient seen and examined at bedside.  No overnight fever, nausea or vomiting.  She feels much better and wants to go home.  Discharge Exam: Vitals:   06/26/17 2117 06/27/17 0620  BP: 110/65 113/72  Pulse: 80 72  Resp: 18 17  Temp: 98 F (36.7 C) (!) 97.5 F (36.4 C)  SpO2: 97% 98%   Vitals:   06/26/17 0455 06/26/17 1506 06/26/17 2117 06/27/17 0620  BP: (!) 98/58 114/69 110/65 113/72  Pulse: 76 83 80 72  Resp: 18 18 18 17   Temp: 98.2 F (36.8 C) 98.8 F (37.1 C) 98 F (36.7 C) (!) 97.5 F (36.4 C)  TempSrc: Oral Oral Oral Oral  SpO2: 98% 98% 97% 98%  Weight: 106.6 kg (235 lb)     Height:        General: Pt is alert, awake, not in acute distress Cardiovascular: Rate controlled, S1/S2 + Respiratory: Bilateral decreased breath sounds at bases Abdominal: Soft, NT, ND, bowel sounds + Extremities: no edema, no cyanosis    The results of significant diagnostics from this hospitalization (including imaging, microbiology, ancillary and laboratory) are listed below for reference.     Microbiology: Recent Results (from the past 240 hour(s))  Blood Culture (routine x 2)     Status: None (Preliminary result)   Collection Time: 06/25/17  1:48 PM  Result Value Ref Range Status   Specimen Description BLOOD RIGHT FOREARM  Final   Special Requests   Final    BOTTLES DRAWN AEROBIC ONLY Blood Culture results may not be optimal due to an inadequate volume of blood received in culture bottles   Culture   Final    NO GROWTH 1 DAY Performed at [facility], [address] [place], [place] [address]    Report  Status PENDING  Incomplete  Blood Culture (routine x 2)     Status: None (Preliminary result)   Collection Time: 06/25/17  2:39 PM  Result Value Ref Range Status   Specimen Description BLOOD RIGHT FOREARM  Final   Special Requests   Final    BOTTLES DRAWN AEROBIC AND ANAEROBIC Blood Culture adequate volume   Culture   Final    NO GROWTH < 24 HOURS Performed at [facility], [address] [place], [place] [address]    Report Status PENDING  Incomplete     Labs: BNP (last 3 results) No results for input(s): BNP in the last 8760 hours. Basic Metabolic Panel: Recent Labs  Lab 06/25/17 1044 06/26/17 0325 06/27/17 0639  NA 138 139 138  K 3.6 3.4* 4.0  CL 104 110 106  CO2 21* 23 23  GLUCOSE 145* 150* 149*  BUN 6 <5* <5*  CREATININE 0.58 0.51 0.60  CALCIUM 9.0 8.1* 8.7*  MG  --   --  1.9   Liver Function Tests: Recent Labs  Lab 06/25/17 1044 06/26/17 0325  AST 31 19  ALT 19  13*  ALKPHOS 77 62  BILITOT 0.6 0.6  PROT 6.3* 4.8*  ALBUMIN 3.4* 2.5*   No results for input(s): LIPASE, AMYLASE in the last 168 hours. No results for input(s): AMMONIA in the last 168 hours. CBC: Recent Labs  Lab 06/25/17 1044 06/26/17 0325  WBC 11.6* 8.6  NEUTROABS 7.1  --   HGB 12.4 10.3*  HCT 39.9 33.8*  MCV 85.1 85.1  PLT 374 323   Cardiac Enzymes: No results for input(s): CKTOTAL, CKMB, CKMBINDEX, TROPONINI in the last 168 hours. BNP: Invalid input(s): POCBNP CBG: Recent Labs  Lab 06/26/17 1657 06/26/17 2115 06/27/17 0819  GLUCAP 162* 133* 162*   D-Dimer No results for input(s): DDIMER in the last 72 hours. Hgb A1c Recent Labs    06/26/17 0325  HGBA1C 7.3*   Lipid Profile No results for input(s): CHOL, HDL, LDLCALC, TRIG, CHOLHDL, LDLDIRECT in the last 72 hours. Thyroid function studies No results for input(s): TSH, T4TOTAL, T3FREE, THYROIDAB in the last 72 hours.  Invalid input(s): FREET3 Anemia work up No results for input(s): VITAMINB12, FOLATE,  FERRITIN, TIBC, IRON, RETICCTPCT in the last 72 hours. Urinalysis    Component Value Date/Time   COLORURINE YELLOW 06/25/2017 1044   APPEARANCEUR CLEAR 06/25/2017 1044   LABSPEC 1.008 06/25/2017 1044   PHURINE 6.0 06/25/2017 1044   GLUCOSEU NEGATIVE 06/25/2017 1044   HGBUR SMALL (A) 06/25/2017 1044   BILIRUBINUR NEGATIVE 06/25/2017 1044   KETONESUR NEGATIVE 06/25/2017 1044   PROTEINUR NEGATIVE 06/25/2017 1044   NITRITE NEGATIVE 06/25/2017 1044   LEUKOCYTESUR NEGATIVE 06/25/2017 1044   Sepsis Labs Invalid input(s): PROCALCITONIN,  WBC,  LACTICIDVEN Microbiology Recent Results (from the past 240 hour(s))  Blood Culture (routine x 2)     Status: None (Preliminary result)   Collection Time: 06/25/17  1:48 PM  Result Value Ref Range Status   Specimen Description BLOOD RIGHT FOREARM  Final   Special Requests   Final    BOTTLES DRAWN AEROBIC ONLY Blood Culture results may not be optimal due to an inadequate volume of blood received in culture bottles   Culture   Final    NO GROWTH 1 DAY Performed at [facility], [address], [place], [place] [address]    Report Status PENDING  Incomplete  Blood Culture (routine x 2)     Status: None (Preliminary result)   Collection Time: 06/25/17  2:39 PM  Result Value Ref Range Status   Specimen Description BLOOD RIGHT FOREARM  Final   Special Requests   Final    BOTTLES DRAWN AEROBIC AND ANAEROBIC Blood Culture adequate volume   Culture   Final    NO GROWTH < 24 HOURS Performed at [facility], [address] [place], [place] [address]    Report Status PENDING  Incomplete     Time coordinating discharge: 35 minutes  SIGNED:   Glade Lloyd, MD  [facility] 06/27/2017, 10:53 AM Pager: [phone]  If 7PM-7AM, please contact night-coverage [email or web address] Password [email or web address]

## 2017-06-27 NOTE — Progress Notes (Signed)
Inpatient Diabetes Program Recommendations  AACE/ADA: New Consensus Statement on Inpatient Glycemic Control (2015)  Target Ranges:  Prepandial:   less than 140 mg/dL      Peak postprandial:   less than 180 mg/dL (1-2 hours)      Critically ill patients:  140 - 180 mg/dL   Lab Results  Component Value Date   GLUCAP 162 (H) 06/27/2017   HGBA1C 7.3 (H) 06/26/2017    Review of Glycemic ControlResults for Ashley EtienneCLINE, Ashley Donaldson (MRN [id number]) as of 06/27/2017 09:35  Ref. Range 06/26/2017 16:57 06/26/2017 21:15 06/27/2017 08:19  Glucose-Capillary Latest Ref Range: 65 - 99 mg/dL 409162 (H) 811133 (H) 914162 (H)    Diabetes history: Type 2 DM-New onset Outpatient Diabetes medications:  None Current orders for Inpatient glycemic control:  Novolog resistant tid with meals and HS  Inpatient Diabetes Program Recommendations:    Note new diagnosis.  At d/c should be able to start oral DM medications such as Metformin 500 mg bid. Will call and discuss with RN.  Please order glucose meter/strips as well. Needs to follow-up with PCP as soon as possible.   Thanks,  Beryl MeagerJenny Dreyah Montrose, RN, BC-ADM Inpatient Diabetes Coordinator Pager [phone] (8a-5p)

## 2017-06-27 NOTE — Progress Notes (Signed)
Reviewed with pt her diabetic teaching along with her follow up appointments. All questions were answered. IV site was removed and telebox was removed. AVS was reviewed with pt and family. Also reviewed abx as well as the importance of completing the full course. Pt with no further questions. Pt to be taken downstairs via wheelchair accompanied by a staff member and her family

## 2017-06-27 NOTE — Consult Note (Signed)
S: Feeling better. No pain. Swelling feels smaller   Past Medical History:  Diagnosis Date  . Anxiety   . Hypothyroidism   . Obesity   . Periapical abscess with facial involvement 05/31/2027    Past Surgical History:  Procedure Laterality Date  . COLON SURGERY  1994  . MULTIPLE EXTRACTIONS WITH ALVEOLOPLASTY N/A 06/01/2017   Procedure: MULTIPLE EXTRACTION WITH IRRIGATION AND DEBRIDEMENT;  Surgeon: Diona Browner, DDS;  Location: [facility];  Service: Oral Surgery;  Laterality: N/A;  . ROOT CANAL      Family History  Problem Relation Age of Onset  . Thyroid disease Mother   . Heart attack Father     Social History:  reports that she has been smoking cigarettes.  She has a 2.50 pack-year smoking history. she has never used smokeless tobacco. She reports that she does not drink alcohol or use drugs.  Allergies:  Allergies  Allergen Reactions  . Methylpyrrolidone Shortness Of Breath    Reported by [facility] 04/24/14 - pt not aware of this allergy  . Ciprofloxacin Rash  . Omni-Pac Rash    "cefdinir" Can take PCN per patient.  TDD. 06/2017.    Medications: I have reviewed the patient's current medications.  Results for orders placed or performed during the hospital encounter of 06/25/17 (from the past 48 hour(s))  Comprehensive metabolic panel     Status: Abnormal   Collection Time: 06/25/17 10:44 AM  Result Value Ref Range   Sodium 138 135 - 145 mmol/L   Potassium 3.6 3.5 - 5.1 mmol/L   Chloride 104 101 - 111 mmol/L   CO2 21 (L) 22 - 32 mmol/L   Glucose, Bld 145 (H) 65 - 99 mg/dL   BUN 6 6 - 20 mg/dL   Creatinine, Ser 0.58 0.44 - 1.00 mg/dL   Calcium 9.0 8.9 - 10.3 mg/dL   Total Protein 6.3 (L) 6.5 - 8.1 g/dL   Albumin 3.4 (L) 3.5 - 5.0 g/dL   AST 31 15 - 41 U/L   ALT 19 14 - 54 U/L   Alkaline Phosphatase 77 38 - 126 U/L   Total Bilirubin 0.6 0.3 - 1.2 mg/dL   GFR calc non Af Amer >60 >60 mL/min   GFR calc Af Amer >60 >60 mL/min    Comment: (NOTE) The eGFR has  been calculated using the CKD EPI equation. This calculation has not been validated in all clinical situations. eGFR's persistently <60 mL/min signify possible Chronic Kidney Disease.    Anion gap 13 5 - 15    Comment: Performed at [facility] [address], [place], [place] [address]  CBC with Differential     Status: Abnormal   Collection Time: 06/25/17 10:44 AM  Result Value Ref Range   WBC 11.6 (H) 4.0 - 10.5 K/uL   RBC 4.69 3.87 - 5.11 MIL/uL   Hemoglobin 12.4 12.0 - 15.0 g/dL   HCT 39.9 36.0 - 46.0 %   MCV 85.1 78.0 - 100.0 fL   MCH 26.4 26.0 - 34.0 pg   MCHC 31.1 30.0 - 36.0 g/dL   RDW 16.9 (H) 11.5 - 15.5 %   Platelets 374 150 - 400 K/uL   Neutrophils Relative % 62 %   Neutro Abs 7.1 1.7 - 7.7 K/uL   Lymphocytes Relative 31 %   Lymphs Abs 3.6 0.7 - 4.0 K/uL   Monocytes Relative 6 %   Monocytes Absolute 0.7 0.1 - 1.0 K/uL   Eosinophils Relative 1 %  Eosinophils Absolute 0.1 0.0 - 0.7 K/uL   Basophils Relative 0 %   Basophils Absolute 0.1 0.0 - 0.1 K/uL    Comment: Performed at [facility], [place] [address] [place], [place] [address]  Urinalysis, Routine w reflex microscopic     Status: Abnormal   Collection Time: 06/25/17 10:44 AM  Result Value Ref Range   Color, Urine YELLOW YELLOW   APPearance CLEAR CLEAR   Specific Gravity, Urine 1.008 1.005 - 1.030   pH 6.0 5.0 - 8.0   Glucose, UA NEGATIVE NEGATIVE mg/dL   Hgb urine dipstick SMALL (A) NEGATIVE   Bilirubin Urine NEGATIVE NEGATIVE   Ketones, ur NEGATIVE NEGATIVE mg/dL   Protein, ur NEGATIVE NEGATIVE mg/dL   Nitrite NEGATIVE NEGATIVE   Leukocytes, UA NEGATIVE NEGATIVE   RBC / HPF 0-5 0 - 5 RBC/hpf   WBC, UA 0-5 0 - 5 WBC/hpf   Bacteria, UA FEW (A) NONE SEEN   Squamous Epithelial / LPF 0-5 (A) NONE SEEN   Mucus PRESENT     Comment: Performed at [facility], [place] [address] [place], [place] [address]  I-Stat beta hCG blood, ED     Status: None   Collection Time: 06/25/17 11:16 AM   Result Value Ref Range   I-stat hCG, quantitative <5.0 <5 mIU/mL   Comment 3            Comment:   GEST. AGE      CONC.  (mIU/mL)   <=1 WEEK        5 - 50     2 WEEKS       50 - 500     3 WEEKS       100 - 10,000     4 WEEKS     1,000 - 30,000        FEMALE AND NON-PREGNANT FEMALE:     LESS THAN 5 mIU/mL   I-Stat CG4 Lactic Acid, ED     Status: Abnormal   Collection Time: 06/25/17 11:19 AM  Result Value Ref Range   Lactic Acid, Venous 3.04 (HH) 0.5 - 1.9 mmol/L   Comment NOTIFIED PHYSICIAN   I-Stat CG4 Lactic Acid, ED     Status: Abnormal   Collection Time: 06/25/17  1:26 PM  Result Value Ref Range   Lactic Acid, Venous 2.64 (HH) 0.5 - 1.9 mmol/L   Comment NOTIFIED PHYSICIAN   Blood Culture (routine x 2)     Status: None (Preliminary result)   Collection Time: 06/25/17  1:48 PM  Result Value Ref Range   Specimen Description BLOOD RIGHT FOREARM    Special Requests      BOTTLES DRAWN AEROBIC ONLY Blood Culture results may not be optimal due to an inadequate volume of blood received in culture bottles   Culture      NO GROWTH 1 DAY Performed at [facility], [address], [place], [place] [address]    Report Status PENDING   Blood Culture (routine x 2)     Status: None (Preliminary result)   Collection Time: 06/25/17  2:39 PM  Result Value Ref Range   Specimen Description BLOOD RIGHT FOREARM    Special Requests      BOTTLES DRAWN AEROBIC AND ANAEROBIC Blood Culture adequate volume   Culture      NO GROWTH < 24 HOURS Performed at [facility], [place] [address] [place], [place] [address]    Report Status PENDING  I-Stat CG4 Lactic Acid, ED  (not at  [facility])     Status: None   Collection Time: 06/25/17  3:59 PM  Result Value Ref Range   Lactic Acid, Venous 1.85 0.5 - 1.9 mmol/L  Comprehensive metabolic panel     Status: Abnormal   Collection Time: 06/26/17  3:25 AM  Result Value Ref Range   Sodium 139 135 - 145 mmol/L   Potassium 3.4 (L) 3.5 - 5.1 mmol/L    Chloride 110 101 - 111 mmol/L   CO2 23 22 - 32 mmol/L   Glucose, Bld 150 (H) 65 - 99 mg/dL   BUN <5 (L) 6 - 20 mg/dL   Creatinine, Ser 0.51 0.44 - 1.00 mg/dL   Calcium 8.1 (L) 8.9 - 10.3 mg/dL   Total Protein 4.8 (L) 6.5 - 8.1 g/dL   Albumin 2.5 (L) 3.5 - 5.0 g/dL   AST 19 15 - 41 U/L   ALT 13 (L) 14 - 54 U/L   Alkaline Phosphatase 62 38 - 126 U/L   Total Bilirubin 0.6 0.3 - 1.2 mg/dL   GFR calc non Af Amer >60 >60 mL/min   GFR calc Af Amer >60 >60 mL/min    Comment: (NOTE) The eGFR has been calculated using the CKD EPI equation. This calculation has not been validated in all clinical situations. eGFR's persistently <60 mL/min signify possible Chronic Kidney Disease.    Anion gap 6 5 - 15    Comment: Performed at [facility] [address], [place], [place] [address]  CBC     Status: Abnormal   Collection Time: 06/26/17  3:25 AM  Result Value Ref Range   WBC 8.6 4.0 - 10.5 K/uL   RBC 3.97 3.87 - 5.11 MIL/uL   Hemoglobin 10.3 (L) 12.0 - 15.0 g/dL   HCT 33.8 (L) 36.0 - 46.0 %   MCV 85.1 78.0 - 100.0 fL   MCH 25.9 (L) 26.0 - 34.0 pg   MCHC 30.5 30.0 - 36.0 g/dL   RDW 16.9 (H) 11.5 - 15.5 %   Platelets 323 150 - 400 K/uL    Comment: Performed at [facility], [place] [address], [place], [place] [address]  Sedimentation rate     Status: Abnormal   Collection Time: 06/26/17  3:25 AM  Result Value Ref Range   Sed Rate 25 (H) 0 - 22 mm/hr    Comment: Performed at [facility] [address], [place], [place] [address]  Hemoglobin A1c     Status: Abnormal   Collection Time: 06/26/17  3:25 AM  Result Value Ref Range   Hgb A1c MFr Bld 7.3 (H) 4.8 - 5.6 %    Comment: (NOTE) Pre diabetes:          5.7%-6.4% Diabetes:              >6.4% Glycemic control for   <7.0% adults with diabetes    Mean Plasma Glucose 162.81 mg/dL    Comment: Performed at [facility] [address], [place], [place] [address]  Glucose, capillary     Status: Abnormal   Collection  Time: 06/26/17  4:57 PM  Result Value Ref Range   Glucose-Capillary 162 (H) 65 - 99 mg/dL  Glucose, capillary     Status: Abnormal   Collection Time: 06/26/17  9:15 PM  Result Value Ref Range   Glucose-Capillary 133 (H) 65 - 99 mg/dL  Basic metabolic panel  Status: Abnormal   Collection Time: 06/27/17  6:39 AM  Result Value Ref Range   Sodium 138 135 - 145 mmol/L   Potassium 4.0 3.5 - 5.1 mmol/L    Comment: SLIGHT HEMOLYSIS   Chloride 106 101 - 111 mmol/L   CO2 23 22 - 32 mmol/L   Glucose, Bld 149 (H) 65 - 99 mg/dL   BUN <5 (L) 6 - 20 mg/dL   Creatinine, Ser 0.60 0.44 - 1.00 mg/dL   Calcium 8.7 (L) 8.9 - 10.3 mg/dL   GFR calc non Af Amer >60 >60 mL/min   GFR calc Af Amer >60 >60 mL/min    Comment: (NOTE) The eGFR has been calculated using the CKD EPI equation. This calculation has not been validated in all clinical situations. eGFR's persistently <60 mL/min signify possible Chronic Kidney Disease.    Anion gap 9 5 - 15    Comment: Performed at [facility] [address] [place], [place] [address]  Magnesium     Status: None   Collection Time: 06/27/17  6:39 AM  Result Value Ref Range   Magnesium 1.9 1.7 - 2.4 mg/dL    Comment: Performed at [facility] [address] [place], [place] [address]  Glucose, capillary     Status: Abnormal   Collection Time: 06/27/17  8:19 AM  Result Value Ref Range   Glucose-Capillary 162 (H) 65 - 99 mg/dL    Dg Orthopantogram  Result Date: 06/25/2017 CLINICAL DATA:  Right-sided jaw pain and swelling. EXAM: ORTHOPANTOGRAM/PANORAMIC COMPARISON:  None. FINDINGS: No fracture or other bony abnormality is noted. No lytic lesion is noted. IMPRESSION: No abnormality seen involving the mandible. Electronically Signed   By: Marijo Conception, M.D.   On: 06/25/2017 21:16   Ct Soft Tissue Neck W Contrast  Result Date: 06/25/2017 CLINICAL DATA:  Persistent swelling and low-grade fevers after root canal in January. Evaluate RIGHT neck and  jaw pain. History of Graves disease. EXAM: CT NECK WITH CONTRAST TECHNIQUE: Multidetector CT imaging of the neck was performed using the standard protocol following the bolus administration of intravenous contrast. CONTRAST:  94m ISOVUE-300 IOPAMIDOL (ISOVUE-300) INJECTION 61% COMPARISON:  CT neck May 28, 2017 FINDINGS: PHARYNX AND LARYNX: Normal.  Widely patent airway. SALIVARY GLANDS: Mild RIGHT submandibular gland inflammation without sialolith, ductal dilatation or mass. THYROID: Normal. LYMPH NODES: No lymphadenopathy by CT size criteria. VASCULAR: Normal. LIMITED INTRACRANIAL: Normal. VISUALIZED ORBITS: Normal. MASTOIDS AND VISUALIZED PARANASAL SINUSES: Well-aerated. SKELETON: Scattered dental caries. Interval extraction of approximate tooth 31. Again seen is tooth 9 periapical abscess, trace potential tooth 10 periapical abscess. UPPER CHEST: Lung apices are clear. No superior mediastinal lymphadenopathy. OTHER: Thickened platysma and, mildly edematous RIGHT mild asymmetrically enlarged RIGHT belly of the digastric, RIGHT mylohyoid and RIGHT genioglossus muscles. No residual or recurrent RIGHT lower face all abscess. Mild residual RIGHT floor of mouth and RIGHT lower face soft tissue swelling. IMPRESSION: 1. Mild residual RIGHT lower face transspatial edema/cellulitis. No residual or recurrent abscess. Status post interval extraction approximate tooth 31. 2. Patent airway. Electronically Signed   By: CElon AlasM.D.   On: 06/25/2017 16:51    ROS Blood pressure 113/72, pulse 72, temperature (!) 97.5 F (36.4 C), temperature source Oral, resp. rate 17, height 5' 1"  (1.549 m), weight 235 lb (106.6 kg), last menstrual period 05/30/2017, SpO2 98 %. General appearance: alert, cooperative, no distress and morbidly obese Head: Normocephalic, without obvious abnormality, atraumatic Nose: Nares normal. Septum midline. Mucosa normal.  No drainage or sinus tenderness. Throat: lips, mucosa, and tongue  normal; teeth and gums normal and No purulence, fluctuance, trismus. Oral exam without change. Neck: no adenopathy, supple, symmetrical, trachea midline, thyroid not enlarged, symmetric, no tenderness/mass/nodules and right neck with minimal edema. nontender. no erythema  Assessment/Plan: Stable for D/C home from oral surgery standpoint on oral or IV antibiotics. Will see in office next week. Patient to call for day/time. Continue good oral hygiene.  Diona Browner 06/27/2017, 10:35 AM

## 2017-06-30 LAB — CULTURE, BLOOD (ROUTINE X 2)
Culture: NO GROWTH
Culture: NO GROWTH
Special Requests: ADEQUATE

## 2019-09-27 IMAGING — CT CT NECK W/ CM
4 of 5 series · 16 of 33 positions shown, 18 images · IV contrast (Omni 300)
Comparison: None.

CLINICAL DATA: RIGHT facial swelling after root canal 10 days ago.
On antibiotics without improvement. History of Graves disease.

EXAM:
CT NECK WITH CONTRAST
TECHNIQUE: Multidetector CT imaging of the neck was performed using the
standard protocol following the bolus administration of intravenous
contrast.
CONTRAST:  75mL [id number] IOPAMIDOL (H7SZ5B-133) INJECTION 61%

[Series 3: neck 2.0 (person_name) (person_name) · axial · 0.43mm/px · z∈[-295,-147]mm · 4 of 124 slices shown, 5 images]
[im 25/124  soft-tissue]
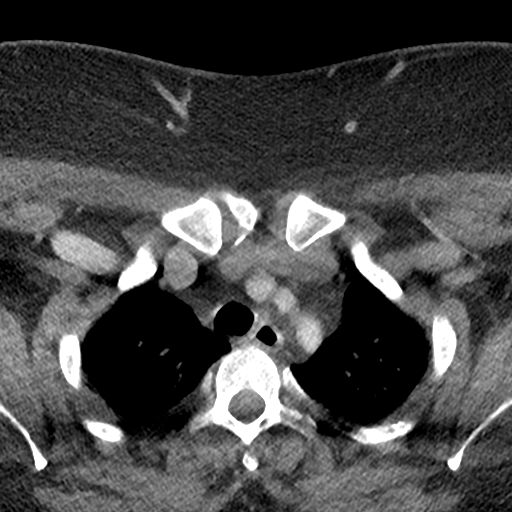
[im 25/124  bone]
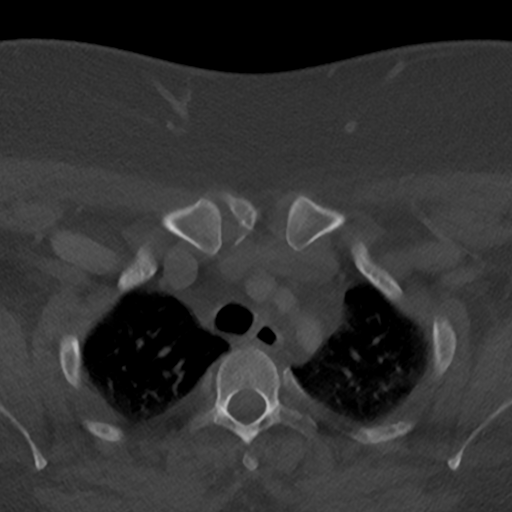
[im 50/124  bone]
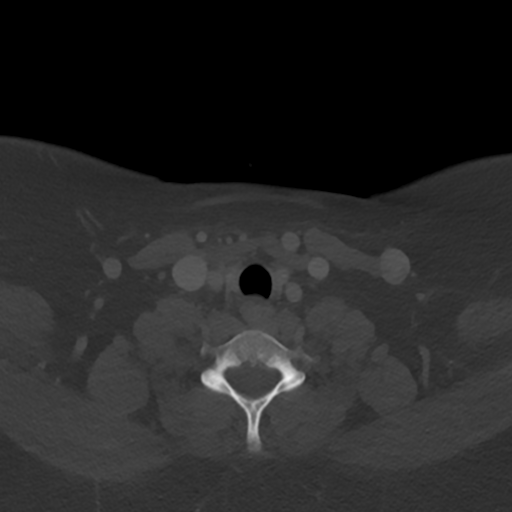
[im 74/124  bone]
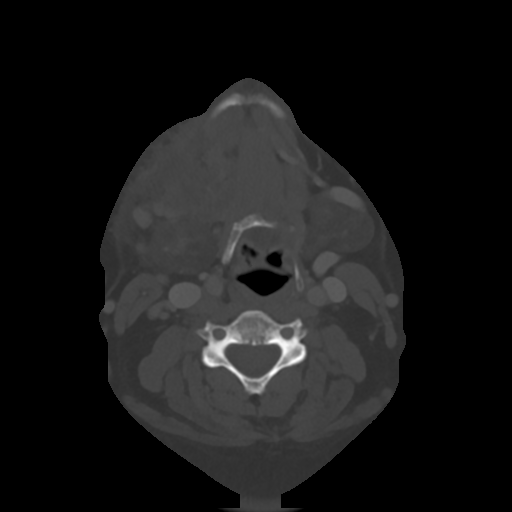
[im 99/124  bone]
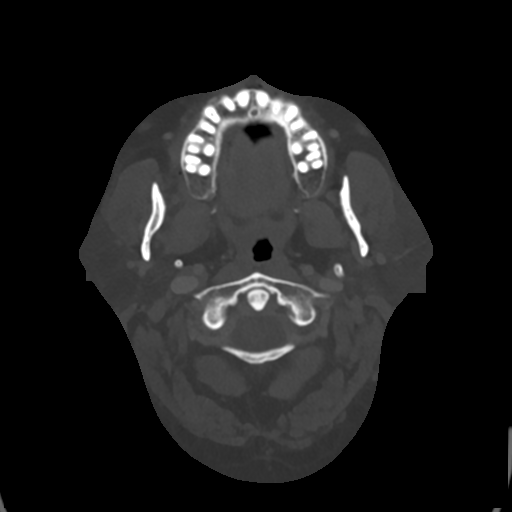

[Series 6: neck 2.0 st · coronal · 0.54mm/px · 3 of 90 slices shown (1 of 2)]
[im 18/90  bone]
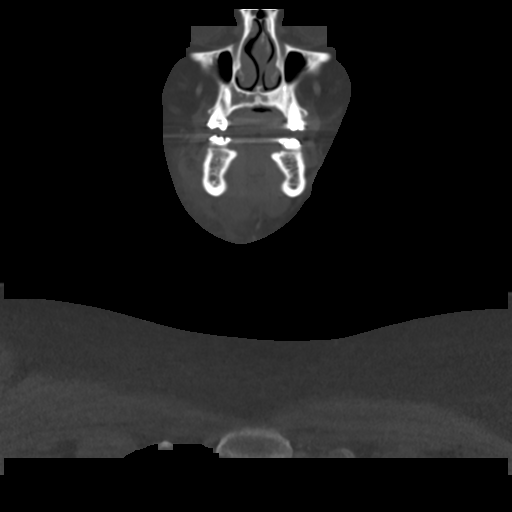
[im 36/90  bone]
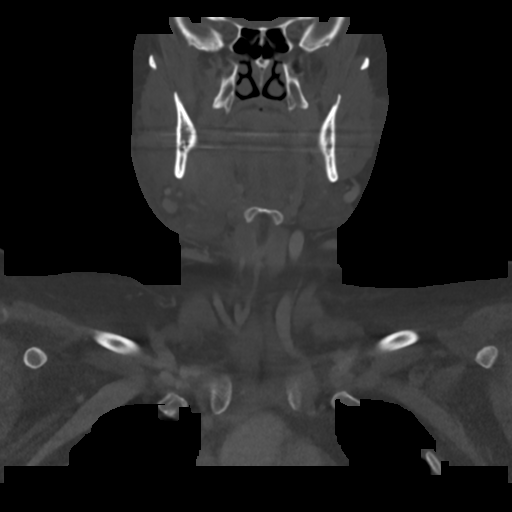
[im 54/90  bone]
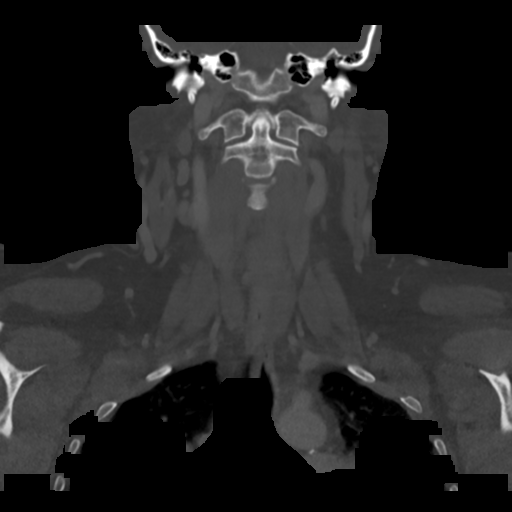

[Series 7: neck 2.0 st orthogonal · axial · 0.39mm/px · z∈[-294,-148]mm · 4 of 123 slices shown]
[im 25/123  bone]
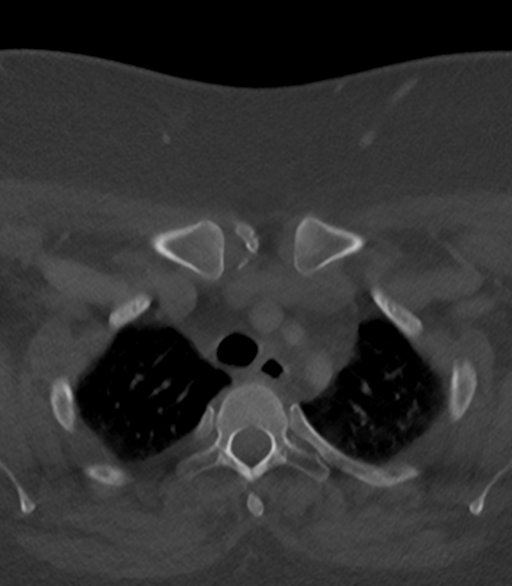
[im 49/123  bone]
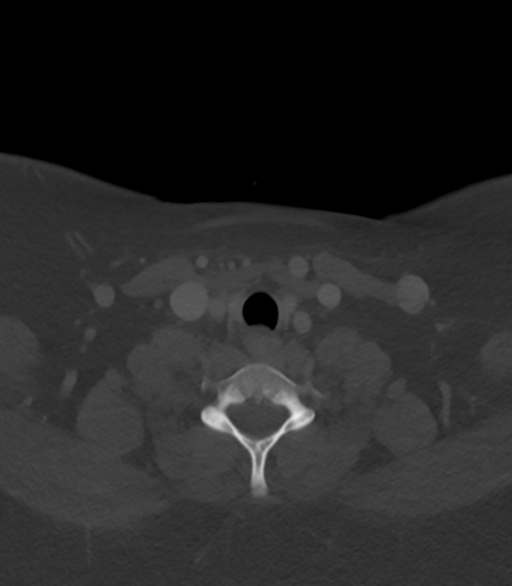
[im 74/123  bone]
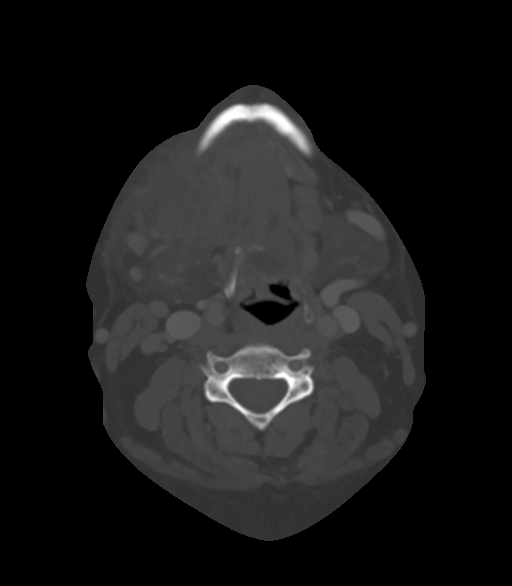
[im 98/123  bone]
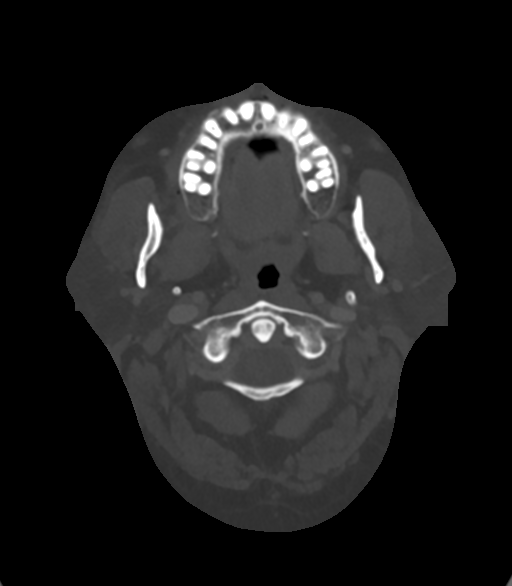

[Series 9: neck 2.0 st · sagittal · 0.48mm/px · 5 of 99 slices shown, 6 images (2 of 2)]
[im 33/99  bone]
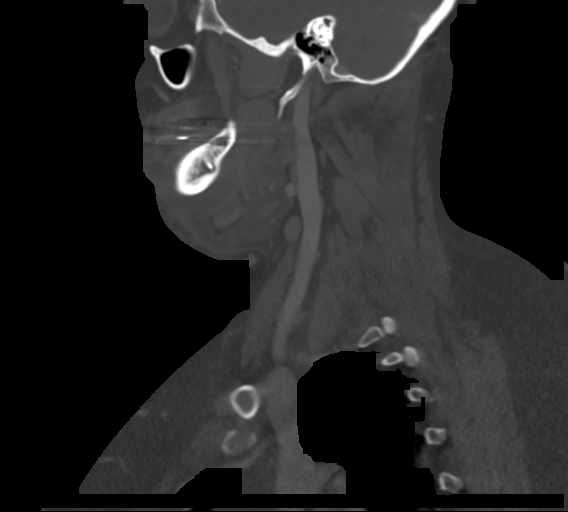
[im 41/99  bone]
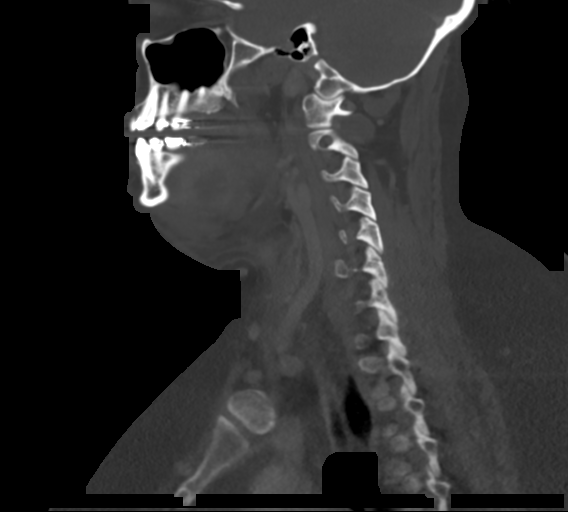
[im 50/99  soft-tissue]
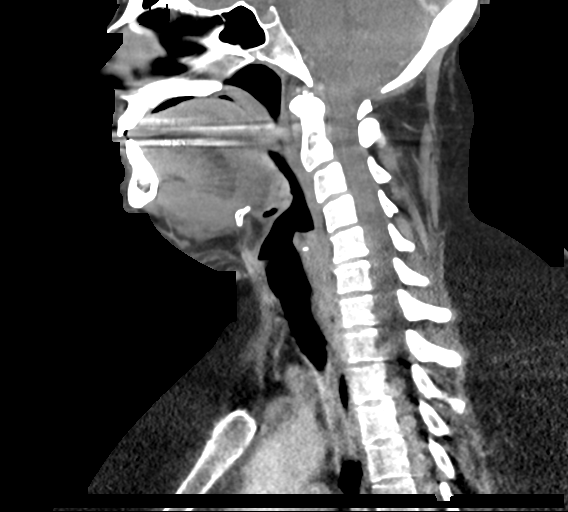
[im 50/99  bone]
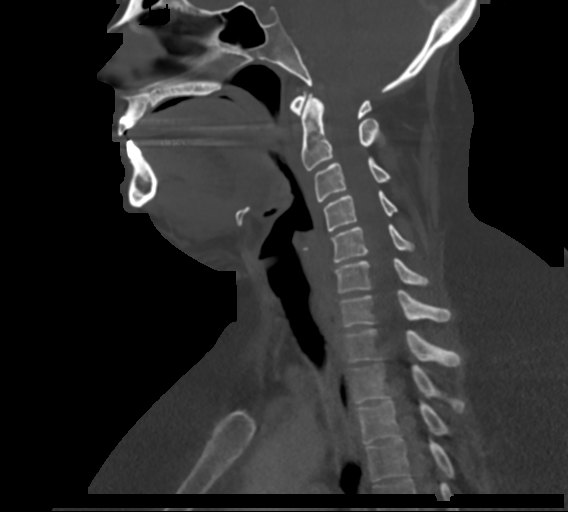
[im 58/99  bone]
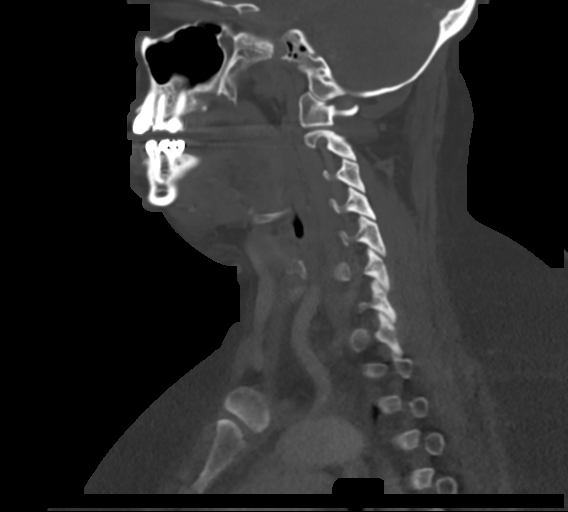
[im 66/99  bone]
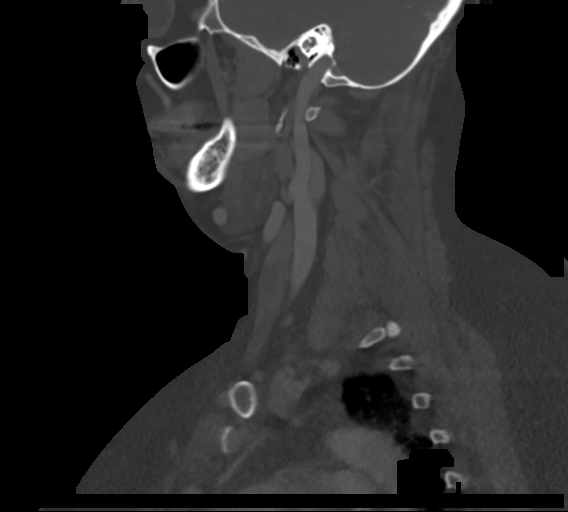

[16 of 33 positions shown; findings below may reference images not displayed]

FINDINGS: PHARYNX AND LARYNX: Mild RIGHT peritonsillar inflammation. Normal
larynx. Normal epiglottis.

SALIVARY GLANDS: Normal.

THYROID: Normal.

LYMPH NODES: Prominent though not pathologically enlarged RIGHT
level 1 B lymph nodes are likely reactive.

VASCULAR: Normal.

LIMITED INTRACRANIAL: Normal.

VISUALIZED ORBITS: Normal.

MASTOIDS AND VISUALIZED PARANASAL SINUSES: Well-aerated.

SKELETON: Scattered dental caries. Tooth 9 and approximate tooth 31
periapical abscess with osseous dehiscence and cortical disruption..

UPPER CHEST: Lung apices are clear. No superior mediastinal
lymphadenopathy.

OTHER: 2.9 x 3.3 x 3.2 cm rim enhancing fluid collection contiguous
with body of the RIGHT mandible. RIGHT lower face free fluid
extending into the RIGHT submandibular space, RIGHT parotid space
with platysma thickening. RIGHT floor of mouth effusion. No
subcutaneous gas or radiopaque foreign bodies. Inflammatory changes
RIGHT retromolar trigone, RIGHT parapharyngeal fat planes
inferiorly.
IMPRESSION: 1. Approximate tooth 31 periapical abscess with osseous dehiscence.
2.9 x 3.3 x 3.2 cm associated RIGHT body of the mandible
subperiosteal abscess. RIGHT lower face cellulitis, transspatial
edema and effusion.

## 2019-10-25 IMAGING — CT CT NECK W/ CM
4 of 5 series · 15 of 35 positions shown, 17 images · IV contrast (iopamidol)
Comparison: CT neck May 28, 2017

CLINICAL DATA: Persistent swelling and low-grade fevers after root
canal in [REDACTED]. Evaluate RIGHT neck and jaw pain. History of
Graves disease.

EXAM:
CT NECK WITH CONTRAST
TECHNIQUE: Multidetector CT imaging of the neck was performed using the
standard protocol following the bolus administration of intravenous
contrast.
CONTRAST:  75mL [id number] IOPAMIDOL (V7GDUA-D88) INJECTION 61%

[Series 3: neck 2.0 st · axial · 0.39mm/px · z∈[-231,-93]mm · 4 of 115 slices shown, 5 images (1 of 3)]
[im 23/115  soft-tissue]
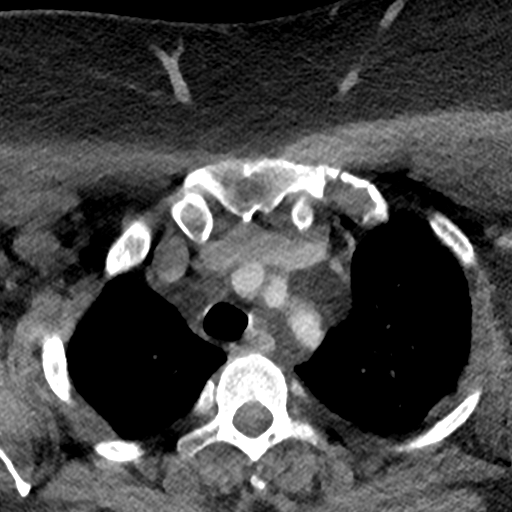
[im 23/115  bone]
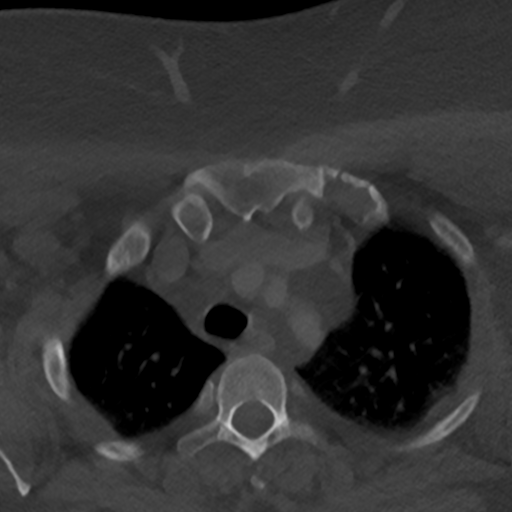
[im 46/115  bone]
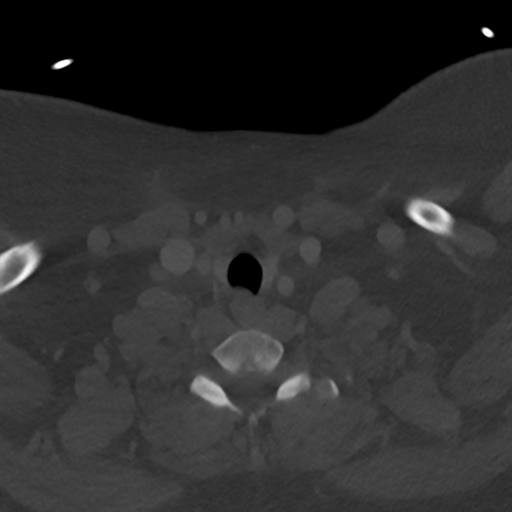
[im 69/115  bone]
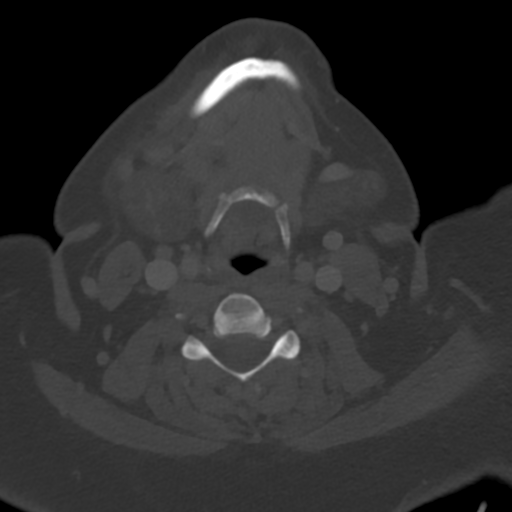
[im 92/115  bone]
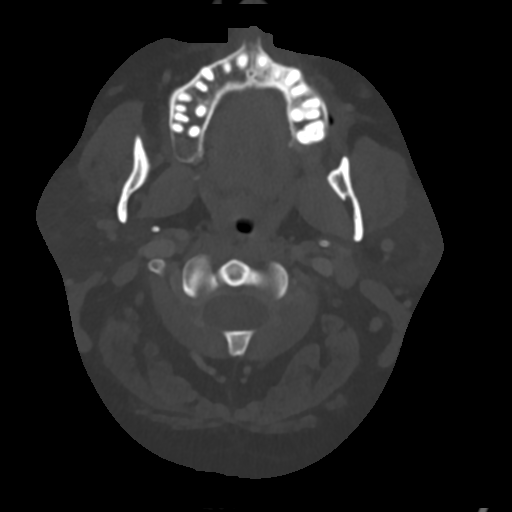

[Series 5: neck 2.0 st · sagittal · 0.45mm/px · 5 of 85 slices shown, 6 images (2 of 3)]
[im 29/85  bone]
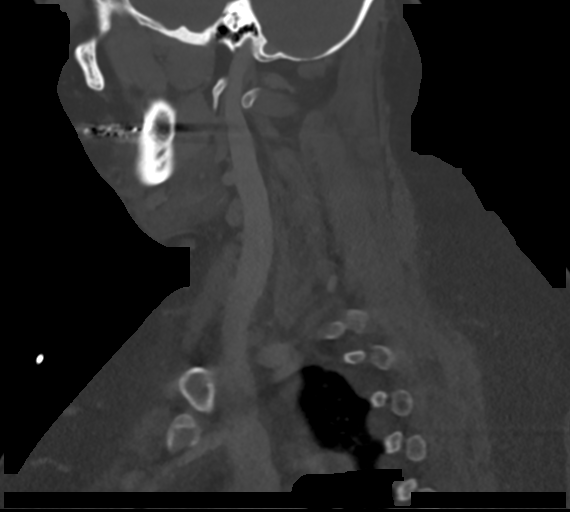
[im 36/85  bone]
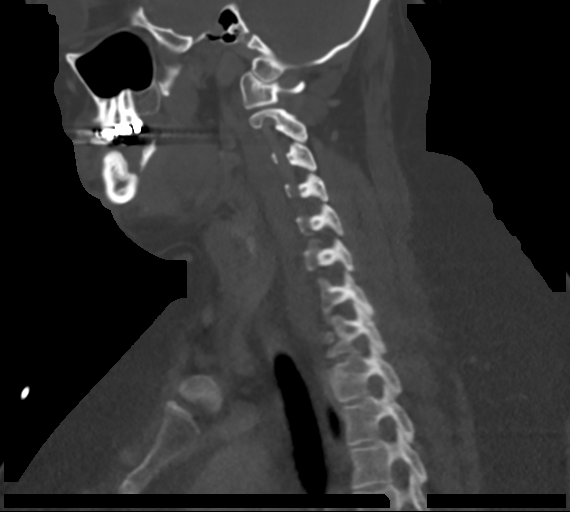
[im 43/85  soft-tissue]
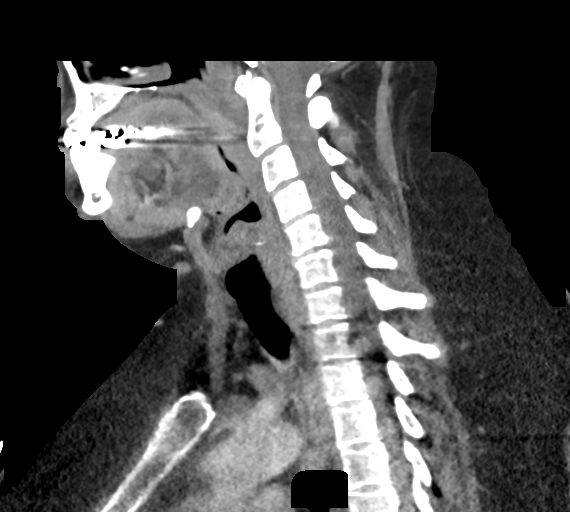
[im 43/85  bone]
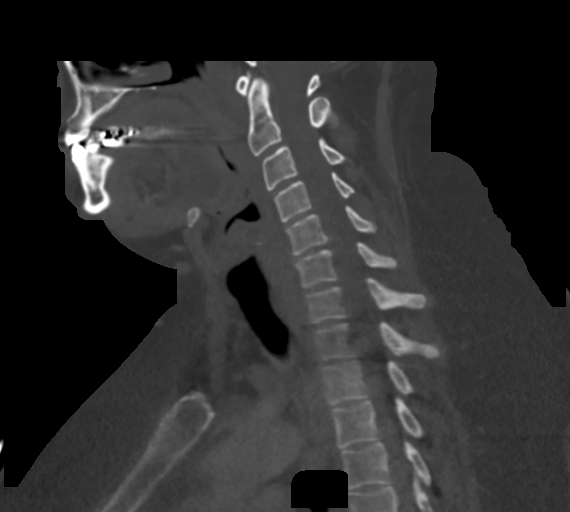
[im 50/85  bone]
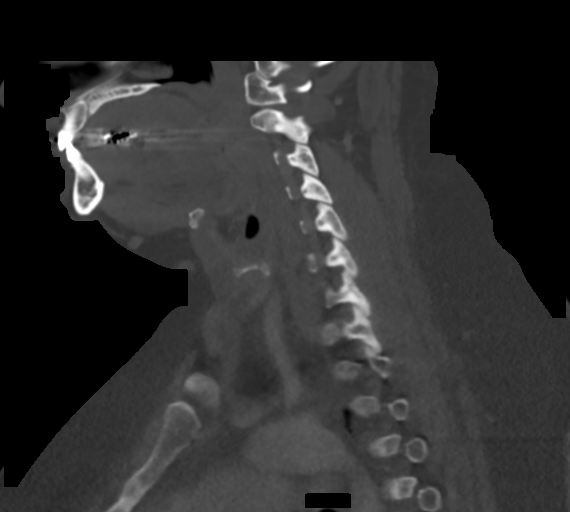
[im 57/85  bone]
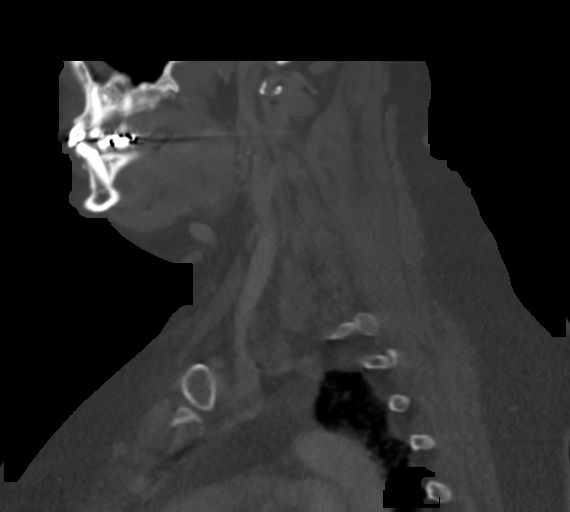

[Series 6: neck 2.0 st · coronal · 0.36mm/px · 3 of 85 slices shown (3 of 3)]
[im 17/85  bone]
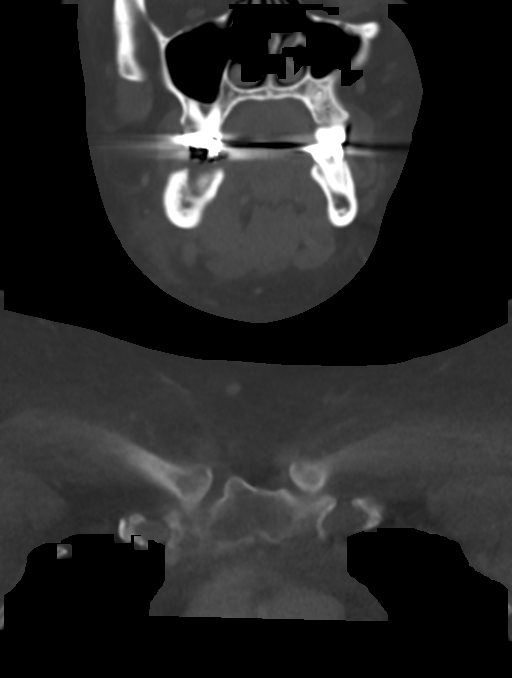
[im 34/85  bone]
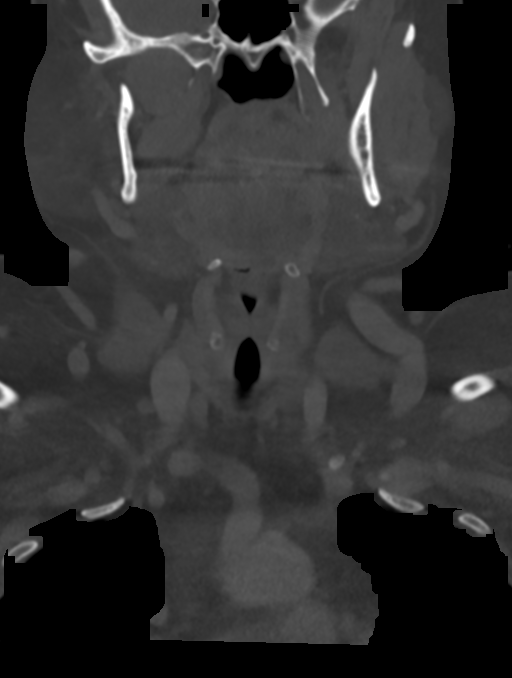
[im 51/85  bone]
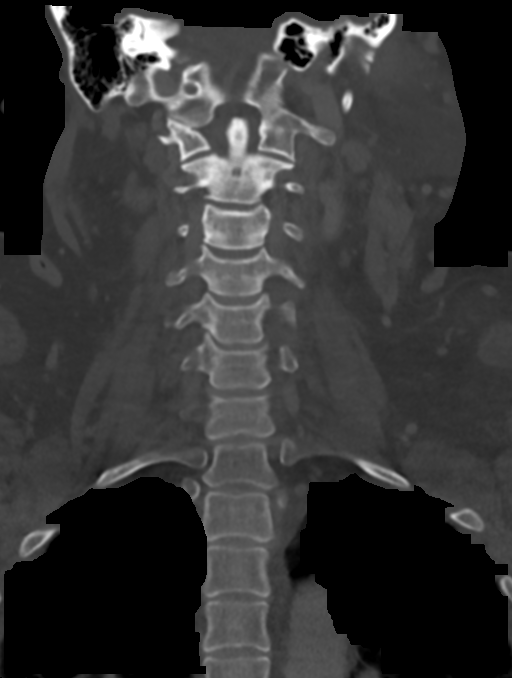

[Series 7: neck 2.0 st orthogonal · axial · 0.39mm/px · z∈[-262,-177]mm · 3 of 114 slices shown]
[im 23/114  bone]
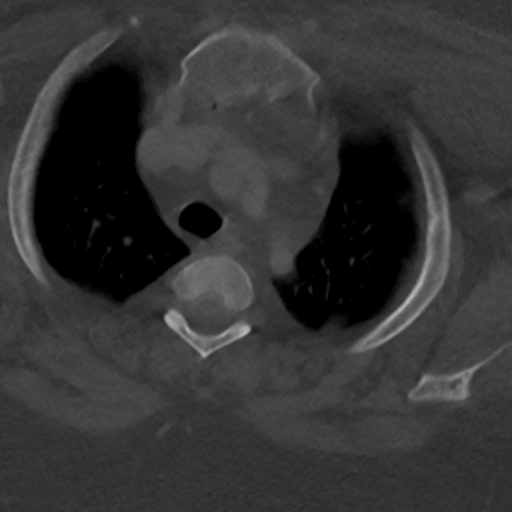
[im 46/114  bone]
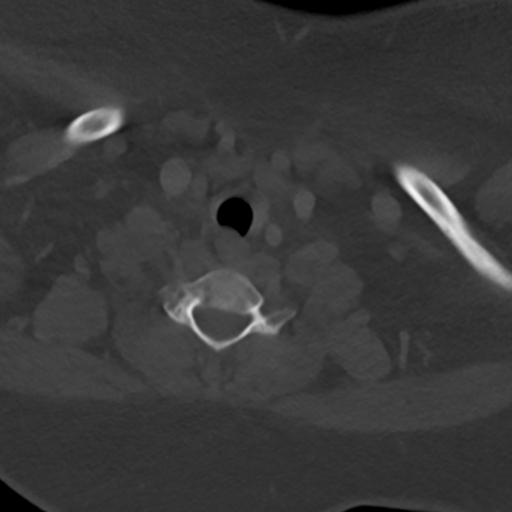
[im 68/114  bone]
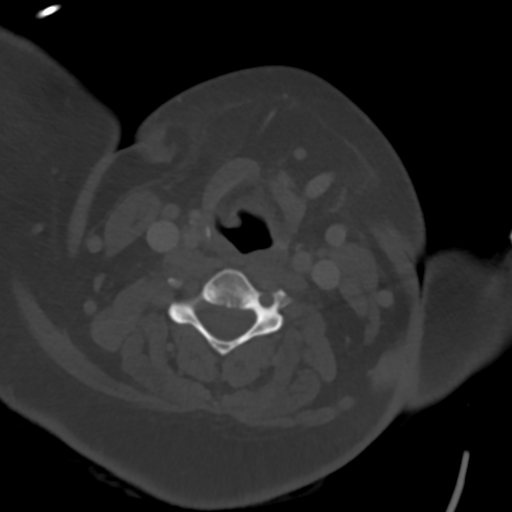

[15 of 35 positions shown; findings below may reference images not displayed]

FINDINGS: PHARYNX AND LARYNX: Normal.  Widely patent airway.

SALIVARY GLANDS: Mild RIGHT submandibular gland inflammation without
sialolith, ductal dilatation or mass.

THYROID: Normal.

LYMPH NODES: No lymphadenopathy by CT size criteria.

VASCULAR: Normal.

LIMITED INTRACRANIAL: Normal.

VISUALIZED ORBITS: Normal.

MASTOIDS AND VISUALIZED PARANASAL SINUSES: Well-aerated.

SKELETON: Scattered dental caries. Interval extraction of
approximate tooth 31. Again seen is tooth 9 periapical abscess,
trace potential tooth 10 periapical abscess.

UPPER CHEST: Lung apices are clear. No superior mediastinal
lymphadenopathy.

OTHER: Thickened platysma and, mildly edematous RIGHT mild
asymmetrically enlarged RIGHT belly of the digastric, RIGHT
mylohyoid and RIGHT genioglossus muscles. No residual or recurrent
RIGHT lower face all abscess. Mild residual RIGHT floor of mouth and
RIGHT lower face soft tissue swelling.
IMPRESSION: 1. Mild residual RIGHT lower face transspatial edema/cellulitis. No
residual or recurrent abscess. Status post interval extraction
approximate tooth 31.
2. Patent airway.
